# Patient Record
Sex: Female | Born: 1940
Health system: Southern US, Community
[De-identification: ages and names within clinical notes are randomized; demographics above are authoritative.]

## PROBLEM LIST (undated history)

## (undated) DIAGNOSIS — C801 Malignant (primary) neoplasm, unspecified: Secondary | ICD-10-CM

## (undated) DIAGNOSIS — F419 Anxiety disorder, unspecified: Secondary | ICD-10-CM

## (undated) DIAGNOSIS — R0600 Dyspnea, unspecified: Secondary | ICD-10-CM

## (undated) DIAGNOSIS — K219 Gastro-esophageal reflux disease without esophagitis: Secondary | ICD-10-CM

## (undated) DIAGNOSIS — E119 Type 2 diabetes mellitus without complications: Secondary | ICD-10-CM

## (undated) DIAGNOSIS — M1990 Unspecified osteoarthritis, unspecified site: Secondary | ICD-10-CM

## (undated) HISTORY — PX: APPENDECTOMY: SHX54

## (undated) HISTORY — PX: EYE SURGERY: SHX253

## (undated) HISTORY — PX: TUBAL LIGATION: SHX77

---

## 2015-02-11 DIAGNOSIS — K219 Gastro-esophageal reflux disease without esophagitis: Secondary | ICD-10-CM | POA: Diagnosis not present

## 2015-02-11 DIAGNOSIS — Q446 Cystic disease of liver: Secondary | ICD-10-CM | POA: Diagnosis not present

## 2015-02-11 DIAGNOSIS — E079 Disorder of thyroid, unspecified: Secondary | ICD-10-CM | POA: Diagnosis not present

## 2015-02-11 DIAGNOSIS — Z720 Tobacco use: Secondary | ICD-10-CM | POA: Diagnosis not present

## 2015-02-11 DIAGNOSIS — F411 Generalized anxiety disorder: Secondary | ICD-10-CM | POA: Diagnosis not present

## 2015-02-11 DIAGNOSIS — E782 Mixed hyperlipidemia: Secondary | ICD-10-CM | POA: Diagnosis not present

## 2015-02-11 DIAGNOSIS — J019 Acute sinusitis, unspecified: Secondary | ICD-10-CM | POA: Diagnosis not present

## 2015-02-11 DIAGNOSIS — R634 Abnormal weight loss: Secondary | ICD-10-CM | POA: Diagnosis not present

## 2015-05-12 DIAGNOSIS — Z720 Tobacco use: Secondary | ICD-10-CM | POA: Diagnosis not present

## 2015-05-12 DIAGNOSIS — F330 Major depressive disorder, recurrent, mild: Secondary | ICD-10-CM | POA: Diagnosis not present

## 2015-05-12 DIAGNOSIS — E782 Mixed hyperlipidemia: Secondary | ICD-10-CM | POA: Diagnosis not present

## 2015-05-12 DIAGNOSIS — J019 Acute sinusitis, unspecified: Secondary | ICD-10-CM | POA: Diagnosis not present

## 2015-05-12 DIAGNOSIS — K219 Gastro-esophageal reflux disease without esophagitis: Secondary | ICD-10-CM | POA: Diagnosis not present

## 2015-05-12 DIAGNOSIS — F411 Generalized anxiety disorder: Secondary | ICD-10-CM | POA: Diagnosis not present

## 2015-05-12 DIAGNOSIS — R634 Abnormal weight loss: Secondary | ICD-10-CM | POA: Diagnosis not present

## 2015-07-30 DIAGNOSIS — Z961 Presence of intraocular lens: Secondary | ICD-10-CM | POA: Diagnosis not present

## 2015-08-11 DIAGNOSIS — K219 Gastro-esophageal reflux disease without esophagitis: Secondary | ICD-10-CM | POA: Diagnosis not present

## 2015-08-11 DIAGNOSIS — E782 Mixed hyperlipidemia: Secondary | ICD-10-CM | POA: Diagnosis not present

## 2015-08-11 DIAGNOSIS — R634 Abnormal weight loss: Secondary | ICD-10-CM | POA: Diagnosis not present

## 2015-08-18 DIAGNOSIS — Z720 Tobacco use: Secondary | ICD-10-CM | POA: Diagnosis not present

## 2015-08-18 DIAGNOSIS — F330 Major depressive disorder, recurrent, mild: Secondary | ICD-10-CM | POA: Diagnosis not present

## 2015-08-18 DIAGNOSIS — E782 Mixed hyperlipidemia: Secondary | ICD-10-CM | POA: Diagnosis not present

## 2015-08-18 DIAGNOSIS — K219 Gastro-esophageal reflux disease without esophagitis: Secondary | ICD-10-CM | POA: Diagnosis not present

## 2015-08-18 DIAGNOSIS — R634 Abnormal weight loss: Secondary | ICD-10-CM | POA: Diagnosis not present

## 2015-08-18 DIAGNOSIS — F411 Generalized anxiety disorder: Secondary | ICD-10-CM | POA: Diagnosis not present

## 2015-08-18 DIAGNOSIS — Z6822 Body mass index (BMI) 22.0-22.9, adult: Secondary | ICD-10-CM | POA: Diagnosis not present

## 2015-11-24 DIAGNOSIS — Z23 Encounter for immunization: Secondary | ICD-10-CM | POA: Diagnosis not present

## 2016-02-17 DIAGNOSIS — K219 Gastro-esophageal reflux disease without esophagitis: Secondary | ICD-10-CM | POA: Diagnosis not present

## 2016-02-17 DIAGNOSIS — E559 Vitamin D deficiency, unspecified: Secondary | ICD-10-CM | POA: Diagnosis not present

## 2016-02-17 DIAGNOSIS — E782 Mixed hyperlipidemia: Secondary | ICD-10-CM | POA: Diagnosis not present

## 2016-02-17 DIAGNOSIS — Q446 Cystic disease of liver: Secondary | ICD-10-CM | POA: Diagnosis not present

## 2016-02-17 DIAGNOSIS — E079 Disorder of thyroid, unspecified: Secondary | ICD-10-CM | POA: Diagnosis not present

## 2016-02-20 DIAGNOSIS — E782 Mixed hyperlipidemia: Secondary | ICD-10-CM | POA: Diagnosis not present

## 2016-02-20 DIAGNOSIS — Z720 Tobacco use: Secondary | ICD-10-CM | POA: Diagnosis not present

## 2016-02-20 DIAGNOSIS — F330 Major depressive disorder, recurrent, mild: Secondary | ICD-10-CM | POA: Diagnosis not present

## 2016-02-20 DIAGNOSIS — K219 Gastro-esophageal reflux disease without esophagitis: Secondary | ICD-10-CM | POA: Diagnosis not present

## 2016-02-20 DIAGNOSIS — F411 Generalized anxiety disorder: Secondary | ICD-10-CM | POA: Diagnosis not present

## 2016-02-20 DIAGNOSIS — R634 Abnormal weight loss: Secondary | ICD-10-CM | POA: Diagnosis not present

## 2016-02-20 DIAGNOSIS — Z6820 Body mass index (BMI) 20.0-20.9, adult: Secondary | ICD-10-CM | POA: Diagnosis not present

## 2016-03-08 DIAGNOSIS — K219 Gastro-esophageal reflux disease without esophagitis: Secondary | ICD-10-CM | POA: Diagnosis not present

## 2016-03-08 DIAGNOSIS — Q446 Cystic disease of liver: Secondary | ICD-10-CM | POA: Diagnosis not present

## 2016-05-11 DIAGNOSIS — Q446 Cystic disease of liver: Secondary | ICD-10-CM | POA: Diagnosis not present

## 2016-05-11 DIAGNOSIS — K219 Gastro-esophageal reflux disease without esophagitis: Secondary | ICD-10-CM | POA: Diagnosis not present

## 2016-05-17 DIAGNOSIS — F411 Generalized anxiety disorder: Secondary | ICD-10-CM | POA: Diagnosis not present

## 2016-05-17 DIAGNOSIS — R634 Abnormal weight loss: Secondary | ICD-10-CM | POA: Diagnosis not present

## 2016-05-17 DIAGNOSIS — Z681 Body mass index (BMI) 19 or less, adult: Secondary | ICD-10-CM | POA: Diagnosis not present

## 2016-05-17 DIAGNOSIS — E875 Hyperkalemia: Secondary | ICD-10-CM | POA: Diagnosis not present

## 2016-05-17 DIAGNOSIS — Z720 Tobacco use: Secondary | ICD-10-CM | POA: Diagnosis not present

## 2016-06-07 DIAGNOSIS — Z720 Tobacco use: Secondary | ICD-10-CM | POA: Diagnosis not present

## 2016-06-07 DIAGNOSIS — F330 Major depressive disorder, recurrent, mild: Secondary | ICD-10-CM | POA: Diagnosis not present

## 2016-06-07 DIAGNOSIS — K219 Gastro-esophageal reflux disease without esophagitis: Secondary | ICD-10-CM | POA: Diagnosis not present

## 2016-06-07 DIAGNOSIS — R634 Abnormal weight loss: Secondary | ICD-10-CM | POA: Diagnosis not present

## 2016-06-07 DIAGNOSIS — F411 Generalized anxiety disorder: Secondary | ICD-10-CM | POA: Diagnosis not present

## 2016-06-07 DIAGNOSIS — E875 Hyperkalemia: Secondary | ICD-10-CM | POA: Diagnosis not present

## 2016-06-07 DIAGNOSIS — E782 Mixed hyperlipidemia: Secondary | ICD-10-CM | POA: Diagnosis not present

## 2016-06-10 DIAGNOSIS — R1110 Vomiting, unspecified: Secondary | ICD-10-CM | POA: Diagnosis not present

## 2016-06-10 DIAGNOSIS — R634 Abnormal weight loss: Secondary | ICD-10-CM | POA: Diagnosis not present

## 2016-06-10 DIAGNOSIS — R911 Solitary pulmonary nodule: Secondary | ICD-10-CM | POA: Diagnosis not present

## 2016-06-10 DIAGNOSIS — K5790 Diverticulosis of intestine, part unspecified, without perforation or abscess without bleeding: Secondary | ICD-10-CM | POA: Diagnosis not present

## 2016-06-10 DIAGNOSIS — N281 Cyst of kidney, acquired: Secondary | ICD-10-CM | POA: Diagnosis not present

## 2016-06-10 DIAGNOSIS — J439 Emphysema, unspecified: Secondary | ICD-10-CM | POA: Diagnosis not present

## 2016-06-10 DIAGNOSIS — K7689 Other specified diseases of liver: Secondary | ICD-10-CM | POA: Diagnosis not present

## 2016-06-10 DIAGNOSIS — I700 Atherosclerosis of aorta: Secondary | ICD-10-CM | POA: Diagnosis not present

## 2016-06-10 DIAGNOSIS — R1310 Dysphagia, unspecified: Secondary | ICD-10-CM | POA: Diagnosis not present

## 2016-06-10 DIAGNOSIS — R0602 Shortness of breath: Secondary | ICD-10-CM | POA: Diagnosis not present

## 2016-06-10 DIAGNOSIS — K5730 Diverticulosis of large intestine without perforation or abscess without bleeding: Secondary | ICD-10-CM | POA: Diagnosis not present

## 2016-06-10 DIAGNOSIS — E041 Nontoxic single thyroid nodule: Secondary | ICD-10-CM | POA: Diagnosis not present

## 2016-06-22 DIAGNOSIS — R634 Abnormal weight loss: Secondary | ICD-10-CM | POA: Diagnosis not present

## 2016-06-22 DIAGNOSIS — K222 Esophageal obstruction: Secondary | ICD-10-CM | POA: Diagnosis not present

## 2016-06-22 DIAGNOSIS — Z923 Personal history of irradiation: Secondary | ICD-10-CM | POA: Diagnosis not present

## 2016-06-22 DIAGNOSIS — Z9221 Personal history of antineoplastic chemotherapy: Secondary | ICD-10-CM | POA: Diagnosis not present

## 2016-06-22 DIAGNOSIS — R630 Anorexia: Secondary | ICD-10-CM | POA: Diagnosis not present

## 2016-06-22 DIAGNOSIS — F17210 Nicotine dependence, cigarettes, uncomplicated: Secondary | ICD-10-CM | POA: Diagnosis not present

## 2016-06-22 DIAGNOSIS — Z8501 Personal history of malignant neoplasm of esophagus: Secondary | ICD-10-CM | POA: Diagnosis not present

## 2016-06-22 DIAGNOSIS — Z681 Body mass index (BMI) 19 or less, adult: Secondary | ICD-10-CM | POA: Diagnosis not present

## 2016-08-30 DIAGNOSIS — F411 Generalized anxiety disorder: Secondary | ICD-10-CM | POA: Diagnosis not present

## 2016-08-30 DIAGNOSIS — R634 Abnormal weight loss: Secondary | ICD-10-CM | POA: Diagnosis not present

## 2016-08-30 DIAGNOSIS — E782 Mixed hyperlipidemia: Secondary | ICD-10-CM | POA: Diagnosis not present

## 2016-08-30 DIAGNOSIS — E875 Hyperkalemia: Secondary | ICD-10-CM | POA: Diagnosis not present

## 2016-08-30 DIAGNOSIS — Z720 Tobacco use: Secondary | ICD-10-CM | POA: Diagnosis not present

## 2016-08-30 DIAGNOSIS — F3289 Other specified depressive episodes: Secondary | ICD-10-CM | POA: Diagnosis not present

## 2016-08-30 DIAGNOSIS — J439 Emphysema, unspecified: Secondary | ICD-10-CM | POA: Diagnosis not present

## 2016-08-30 DIAGNOSIS — Z681 Body mass index (BMI) 19 or less, adult: Secondary | ICD-10-CM | POA: Diagnosis not present

## 2016-08-30 DIAGNOSIS — C159 Malignant neoplasm of esophagus, unspecified: Secondary | ICD-10-CM | POA: Diagnosis not present

## 2016-08-30 DIAGNOSIS — E440 Moderate protein-calorie malnutrition: Secondary | ICD-10-CM | POA: Diagnosis not present

## 2016-08-30 DIAGNOSIS — I700 Atherosclerosis of aorta: Secondary | ICD-10-CM | POA: Diagnosis not present

## 2016-08-30 DIAGNOSIS — Z23 Encounter for immunization: Secondary | ICD-10-CM | POA: Diagnosis not present

## 2016-08-30 DIAGNOSIS — K222 Esophageal obstruction: Secondary | ICD-10-CM | POA: Diagnosis not present

## 2016-11-26 DIAGNOSIS — Q446 Cystic disease of liver: Secondary | ICD-10-CM | POA: Diagnosis not present

## 2016-11-26 DIAGNOSIS — K219 Gastro-esophageal reflux disease without esophagitis: Secondary | ICD-10-CM | POA: Diagnosis not present

## 2016-11-26 DIAGNOSIS — E875 Hyperkalemia: Secondary | ICD-10-CM | POA: Diagnosis not present

## 2016-11-26 DIAGNOSIS — F411 Generalized anxiety disorder: Secondary | ICD-10-CM | POA: Diagnosis not present

## 2016-11-26 DIAGNOSIS — E782 Mixed hyperlipidemia: Secondary | ICD-10-CM | POA: Diagnosis not present

## 2016-11-30 DIAGNOSIS — E440 Moderate protein-calorie malnutrition: Secondary | ICD-10-CM | POA: Diagnosis not present

## 2016-11-30 DIAGNOSIS — I700 Atherosclerosis of aorta: Secondary | ICD-10-CM | POA: Diagnosis not present

## 2016-11-30 DIAGNOSIS — C159 Malignant neoplasm of esophagus, unspecified: Secondary | ICD-10-CM | POA: Diagnosis not present

## 2016-11-30 DIAGNOSIS — E782 Mixed hyperlipidemia: Secondary | ICD-10-CM | POA: Diagnosis not present

## 2016-11-30 DIAGNOSIS — Z23 Encounter for immunization: Secondary | ICD-10-CM | POA: Diagnosis not present

## 2016-11-30 DIAGNOSIS — J439 Emphysema, unspecified: Secondary | ICD-10-CM | POA: Diagnosis not present

## 2016-11-30 DIAGNOSIS — Z681 Body mass index (BMI) 19 or less, adult: Secondary | ICD-10-CM | POA: Diagnosis not present

## 2017-01-19 DIAGNOSIS — H26491 Other secondary cataract, right eye: Secondary | ICD-10-CM | POA: Diagnosis not present

## 2017-03-01 DIAGNOSIS — F411 Generalized anxiety disorder: Secondary | ICD-10-CM | POA: Diagnosis not present

## 2017-03-01 DIAGNOSIS — E782 Mixed hyperlipidemia: Secondary | ICD-10-CM | POA: Diagnosis not present

## 2017-03-01 DIAGNOSIS — J439 Emphysema, unspecified: Secondary | ICD-10-CM | POA: Diagnosis not present

## 2017-03-01 DIAGNOSIS — E079 Disorder of thyroid, unspecified: Secondary | ICD-10-CM | POA: Diagnosis not present

## 2017-03-01 DIAGNOSIS — E875 Hyperkalemia: Secondary | ICD-10-CM | POA: Diagnosis not present

## 2017-03-02 DIAGNOSIS — H26492 Other secondary cataract, left eye: Secondary | ICD-10-CM | POA: Diagnosis not present

## 2017-03-04 DIAGNOSIS — C159 Malignant neoplasm of esophagus, unspecified: Secondary | ICD-10-CM | POA: Diagnosis not present

## 2017-03-04 DIAGNOSIS — E440 Moderate protein-calorie malnutrition: Secondary | ICD-10-CM | POA: Diagnosis not present

## 2017-03-04 DIAGNOSIS — E782 Mixed hyperlipidemia: Secondary | ICD-10-CM | POA: Diagnosis not present

## 2017-03-04 DIAGNOSIS — Z0001 Encounter for general adult medical examination with abnormal findings: Secondary | ICD-10-CM | POA: Diagnosis not present

## 2017-03-04 DIAGNOSIS — F411 Generalized anxiety disorder: Secondary | ICD-10-CM | POA: Diagnosis not present

## 2017-03-04 DIAGNOSIS — R634 Abnormal weight loss: Secondary | ICD-10-CM | POA: Diagnosis not present

## 2017-03-04 DIAGNOSIS — E875 Hyperkalemia: Secondary | ICD-10-CM | POA: Diagnosis not present

## 2017-03-04 DIAGNOSIS — I700 Atherosclerosis of aorta: Secondary | ICD-10-CM | POA: Diagnosis not present

## 2017-03-04 DIAGNOSIS — Z720 Tobacco use: Secondary | ICD-10-CM | POA: Diagnosis not present

## 2017-03-04 DIAGNOSIS — R911 Solitary pulmonary nodule: Secondary | ICD-10-CM | POA: Diagnosis not present

## 2017-03-04 DIAGNOSIS — F3289 Other specified depressive episodes: Secondary | ICD-10-CM | POA: Diagnosis not present

## 2017-03-23 DIAGNOSIS — Z8501 Personal history of malignant neoplasm of esophagus: Secondary | ICD-10-CM | POA: Diagnosis not present

## 2017-03-23 DIAGNOSIS — K222 Esophageal obstruction: Secondary | ICD-10-CM | POA: Diagnosis not present

## 2017-03-23 DIAGNOSIS — R634 Abnormal weight loss: Secondary | ICD-10-CM | POA: Diagnosis not present

## 2017-03-23 DIAGNOSIS — F17200 Nicotine dependence, unspecified, uncomplicated: Secondary | ICD-10-CM | POA: Diagnosis not present

## 2017-03-23 DIAGNOSIS — R1310 Dysphagia, unspecified: Secondary | ICD-10-CM | POA: Diagnosis not present

## 2017-03-23 DIAGNOSIS — R1110 Vomiting, unspecified: Secondary | ICD-10-CM | POA: Diagnosis not present

## 2017-03-23 DIAGNOSIS — K228 Other specified diseases of esophagus: Secondary | ICD-10-CM | POA: Diagnosis not present

## 2017-03-23 DIAGNOSIS — K449 Diaphragmatic hernia without obstruction or gangrene: Secondary | ICD-10-CM | POA: Diagnosis not present

## 2017-04-27 DIAGNOSIS — F17200 Nicotine dependence, unspecified, uncomplicated: Secondary | ICD-10-CM | POA: Diagnosis not present

## 2017-04-27 DIAGNOSIS — Z7982 Long term (current) use of aspirin: Secondary | ICD-10-CM | POA: Diagnosis not present

## 2017-04-27 DIAGNOSIS — K449 Diaphragmatic hernia without obstruction or gangrene: Secondary | ICD-10-CM | POA: Diagnosis not present

## 2017-04-27 DIAGNOSIS — I10 Essential (primary) hypertension: Secondary | ICD-10-CM | POA: Diagnosis not present

## 2017-04-27 DIAGNOSIS — R1310 Dysphagia, unspecified: Secondary | ICD-10-CM | POA: Diagnosis not present

## 2017-04-27 DIAGNOSIS — Z8501 Personal history of malignant neoplasm of esophagus: Secondary | ICD-10-CM | POA: Diagnosis not present

## 2017-04-27 DIAGNOSIS — Z79899 Other long term (current) drug therapy: Secondary | ICD-10-CM | POA: Diagnosis not present

## 2017-04-27 DIAGNOSIS — K222 Esophageal obstruction: Secondary | ICD-10-CM | POA: Diagnosis not present

## 2017-06-08 DIAGNOSIS — K222 Esophageal obstruction: Secondary | ICD-10-CM | POA: Diagnosis not present

## 2017-06-08 DIAGNOSIS — K449 Diaphragmatic hernia without obstruction or gangrene: Secondary | ICD-10-CM | POA: Diagnosis not present

## 2017-06-08 DIAGNOSIS — Z8501 Personal history of malignant neoplasm of esophagus: Secondary | ICD-10-CM | POA: Diagnosis not present

## 2017-06-08 DIAGNOSIS — E785 Hyperlipidemia, unspecified: Secondary | ICD-10-CM | POA: Diagnosis not present

## 2017-06-08 DIAGNOSIS — Z720 Tobacco use: Secondary | ICD-10-CM | POA: Diagnosis not present

## 2017-07-27 DIAGNOSIS — E785 Hyperlipidemia, unspecified: Secondary | ICD-10-CM | POA: Diagnosis not present

## 2017-07-27 DIAGNOSIS — Z8501 Personal history of malignant neoplasm of esophagus: Secondary | ICD-10-CM | POA: Diagnosis not present

## 2017-07-27 DIAGNOSIS — K449 Diaphragmatic hernia without obstruction or gangrene: Secondary | ICD-10-CM | POA: Diagnosis not present

## 2017-07-27 DIAGNOSIS — K222 Esophageal obstruction: Secondary | ICD-10-CM | POA: Diagnosis not present

## 2017-07-27 DIAGNOSIS — R918 Other nonspecific abnormal finding of lung field: Secondary | ICD-10-CM | POA: Diagnosis not present

## 2017-07-27 DIAGNOSIS — K219 Gastro-esophageal reflux disease without esophagitis: Secondary | ICD-10-CM | POA: Diagnosis not present

## 2017-07-27 DIAGNOSIS — F419 Anxiety disorder, unspecified: Secondary | ICD-10-CM | POA: Diagnosis not present

## 2017-07-27 DIAGNOSIS — J90 Pleural effusion, not elsewhere classified: Secondary | ICD-10-CM | POA: Diagnosis not present

## 2017-07-27 DIAGNOSIS — F329 Major depressive disorder, single episode, unspecified: Secondary | ICD-10-CM | POA: Diagnosis not present

## 2017-07-27 DIAGNOSIS — Z9889 Other specified postprocedural states: Secondary | ICD-10-CM | POA: Diagnosis not present

## 2017-09-02 DIAGNOSIS — K219 Gastro-esophageal reflux disease without esophagitis: Secondary | ICD-10-CM | POA: Diagnosis not present

## 2017-09-02 DIAGNOSIS — E782 Mixed hyperlipidemia: Secondary | ICD-10-CM | POA: Diagnosis not present

## 2017-09-02 DIAGNOSIS — E875 Hyperkalemia: Secondary | ICD-10-CM | POA: Diagnosis not present

## 2017-09-02 DIAGNOSIS — R634 Abnormal weight loss: Secondary | ICD-10-CM | POA: Diagnosis not present

## 2017-09-02 DIAGNOSIS — E079 Disorder of thyroid, unspecified: Secondary | ICD-10-CM | POA: Diagnosis not present

## 2017-09-06 DIAGNOSIS — J439 Emphysema, unspecified: Secondary | ICD-10-CM | POA: Diagnosis not present

## 2017-09-06 DIAGNOSIS — Z681 Body mass index (BMI) 19 or less, adult: Secondary | ICD-10-CM | POA: Diagnosis not present

## 2017-09-06 DIAGNOSIS — Z720 Tobacco use: Secondary | ICD-10-CM | POA: Diagnosis not present

## 2017-09-06 DIAGNOSIS — E440 Moderate protein-calorie malnutrition: Secondary | ICD-10-CM | POA: Diagnosis not present

## 2017-09-06 DIAGNOSIS — E782 Mixed hyperlipidemia: Secondary | ICD-10-CM | POA: Diagnosis not present

## 2017-09-06 DIAGNOSIS — R911 Solitary pulmonary nodule: Secondary | ICD-10-CM | POA: Diagnosis not present

## 2017-09-06 DIAGNOSIS — I700 Atherosclerosis of aorta: Secondary | ICD-10-CM | POA: Diagnosis not present

## 2017-09-06 DIAGNOSIS — R634 Abnormal weight loss: Secondary | ICD-10-CM | POA: Diagnosis not present

## 2017-11-29 DIAGNOSIS — Z23 Encounter for immunization: Secondary | ICD-10-CM | POA: Diagnosis not present

## 2018-02-24 DIAGNOSIS — K219 Gastro-esophageal reflux disease without esophagitis: Secondary | ICD-10-CM | POA: Diagnosis not present

## 2018-02-24 DIAGNOSIS — E782 Mixed hyperlipidemia: Secondary | ICD-10-CM | POA: Diagnosis not present

## 2018-02-24 DIAGNOSIS — Q446 Cystic disease of liver: Secondary | ICD-10-CM | POA: Diagnosis not present

## 2018-02-24 DIAGNOSIS — E875 Hyperkalemia: Secondary | ICD-10-CM | POA: Diagnosis not present

## 2018-02-24 DIAGNOSIS — R634 Abnormal weight loss: Secondary | ICD-10-CM | POA: Diagnosis not present

## 2018-03-06 DIAGNOSIS — C159 Malignant neoplasm of esophagus, unspecified: Secondary | ICD-10-CM | POA: Diagnosis not present

## 2018-03-06 DIAGNOSIS — E440 Moderate protein-calorie malnutrition: Secondary | ICD-10-CM | POA: Diagnosis not present

## 2018-03-06 DIAGNOSIS — R911 Solitary pulmonary nodule: Secondary | ICD-10-CM | POA: Diagnosis not present

## 2018-03-06 DIAGNOSIS — E782 Mixed hyperlipidemia: Secondary | ICD-10-CM | POA: Diagnosis not present

## 2018-03-06 DIAGNOSIS — J439 Emphysema, unspecified: Secondary | ICD-10-CM | POA: Diagnosis not present

## 2018-03-06 DIAGNOSIS — I700 Atherosclerosis of aorta: Secondary | ICD-10-CM | POA: Diagnosis not present

## 2018-03-06 DIAGNOSIS — Z720 Tobacco use: Secondary | ICD-10-CM | POA: Diagnosis not present

## 2018-09-08 DIAGNOSIS — R634 Abnormal weight loss: Secondary | ICD-10-CM | POA: Diagnosis not present

## 2018-09-08 DIAGNOSIS — E079 Disorder of thyroid, unspecified: Secondary | ICD-10-CM | POA: Diagnosis not present

## 2018-09-08 DIAGNOSIS — E782 Mixed hyperlipidemia: Secondary | ICD-10-CM | POA: Diagnosis not present

## 2018-09-08 DIAGNOSIS — K219 Gastro-esophageal reflux disease without esophagitis: Secondary | ICD-10-CM | POA: Diagnosis not present

## 2018-09-08 DIAGNOSIS — E875 Hyperkalemia: Secondary | ICD-10-CM | POA: Diagnosis not present

## 2018-09-11 DIAGNOSIS — Z681 Body mass index (BMI) 19 or less, adult: Secondary | ICD-10-CM | POA: Diagnosis not present

## 2018-09-11 DIAGNOSIS — R634 Abnormal weight loss: Secondary | ICD-10-CM | POA: Diagnosis not present

## 2018-09-11 DIAGNOSIS — I700 Atherosclerosis of aorta: Secondary | ICD-10-CM | POA: Diagnosis not present

## 2018-09-11 DIAGNOSIS — R911 Solitary pulmonary nodule: Secondary | ICD-10-CM | POA: Diagnosis not present

## 2018-09-11 DIAGNOSIS — J439 Emphysema, unspecified: Secondary | ICD-10-CM | POA: Diagnosis not present

## 2018-09-11 DIAGNOSIS — E875 Hyperkalemia: Secondary | ICD-10-CM | POA: Diagnosis not present

## 2018-09-11 DIAGNOSIS — C159 Malignant neoplasm of esophagus, unspecified: Secondary | ICD-10-CM | POA: Diagnosis not present

## 2018-09-11 DIAGNOSIS — E440 Moderate protein-calorie malnutrition: Secondary | ICD-10-CM | POA: Diagnosis not present

## 2018-09-26 DIAGNOSIS — Z08 Encounter for follow-up examination after completed treatment for malignant neoplasm: Secondary | ICD-10-CM | POA: Diagnosis not present

## 2018-12-05 DIAGNOSIS — Z23 Encounter for immunization: Secondary | ICD-10-CM | POA: Diagnosis not present

## 2019-03-09 DIAGNOSIS — E7849 Other hyperlipidemia: Secondary | ICD-10-CM | POA: Diagnosis not present

## 2019-03-09 DIAGNOSIS — F411 Generalized anxiety disorder: Secondary | ICD-10-CM | POA: Diagnosis not present

## 2019-04-12 DIAGNOSIS — Z23 Encounter for immunization: Secondary | ICD-10-CM | POA: Diagnosis not present

## 2019-05-09 DIAGNOSIS — I10 Essential (primary) hypertension: Secondary | ICD-10-CM | POA: Diagnosis not present

## 2019-05-09 DIAGNOSIS — E7849 Other hyperlipidemia: Secondary | ICD-10-CM | POA: Diagnosis not present

## 2019-05-10 DIAGNOSIS — Z23 Encounter for immunization: Secondary | ICD-10-CM | POA: Diagnosis not present

## 2019-05-29 DIAGNOSIS — E079 Disorder of thyroid, unspecified: Secondary | ICD-10-CM | POA: Diagnosis not present

## 2019-05-29 DIAGNOSIS — Q446 Cystic disease of liver: Secondary | ICD-10-CM | POA: Diagnosis not present

## 2019-05-29 DIAGNOSIS — E782 Mixed hyperlipidemia: Secondary | ICD-10-CM | POA: Diagnosis not present

## 2019-05-29 DIAGNOSIS — F17210 Nicotine dependence, cigarettes, uncomplicated: Secondary | ICD-10-CM | POA: Diagnosis not present

## 2019-05-29 DIAGNOSIS — K219 Gastro-esophageal reflux disease without esophagitis: Secondary | ICD-10-CM | POA: Diagnosis not present

## 2019-06-04 DIAGNOSIS — Z681 Body mass index (BMI) 19 or less, adult: Secondary | ICD-10-CM | POA: Diagnosis not present

## 2019-06-04 DIAGNOSIS — F17210 Nicotine dependence, cigarettes, uncomplicated: Secondary | ICD-10-CM | POA: Diagnosis not present

## 2019-06-04 DIAGNOSIS — Z23 Encounter for immunization: Secondary | ICD-10-CM | POA: Diagnosis not present

## 2019-06-04 DIAGNOSIS — F331 Major depressive disorder, recurrent, moderate: Secondary | ICD-10-CM | POA: Diagnosis not present

## 2019-06-04 DIAGNOSIS — E782 Mixed hyperlipidemia: Secondary | ICD-10-CM | POA: Diagnosis not present

## 2019-06-04 DIAGNOSIS — K219 Gastro-esophageal reflux disease without esophagitis: Secondary | ICD-10-CM | POA: Diagnosis not present

## 2019-06-04 DIAGNOSIS — F411 Generalized anxiety disorder: Secondary | ICD-10-CM | POA: Diagnosis not present

## 2019-06-04 DIAGNOSIS — I700 Atherosclerosis of aorta: Secondary | ICD-10-CM | POA: Diagnosis not present

## 2019-07-09 DIAGNOSIS — Q446 Cystic disease of liver: Secondary | ICD-10-CM | POA: Diagnosis not present

## 2019-07-09 DIAGNOSIS — I700 Atherosclerosis of aorta: Secondary | ICD-10-CM | POA: Diagnosis not present

## 2019-07-09 DIAGNOSIS — E875 Hyperkalemia: Secondary | ICD-10-CM | POA: Diagnosis not present

## 2019-07-09 DIAGNOSIS — Z87891 Personal history of nicotine dependence: Secondary | ICD-10-CM | POA: Diagnosis not present

## 2019-09-07 DIAGNOSIS — Q446 Cystic disease of liver: Secondary | ICD-10-CM | POA: Diagnosis not present

## 2019-09-07 DIAGNOSIS — Z87891 Personal history of nicotine dependence: Secondary | ICD-10-CM | POA: Diagnosis not present

## 2019-09-07 DIAGNOSIS — I700 Atherosclerosis of aorta: Secondary | ICD-10-CM | POA: Diagnosis not present

## 2019-09-07 DIAGNOSIS — E875 Hyperkalemia: Secondary | ICD-10-CM | POA: Diagnosis not present

## 2019-09-27 DIAGNOSIS — K219 Gastro-esophageal reflux disease without esophagitis: Secondary | ICD-10-CM | POA: Diagnosis not present

## 2019-09-27 DIAGNOSIS — E875 Hyperkalemia: Secondary | ICD-10-CM | POA: Diagnosis not present

## 2019-09-27 DIAGNOSIS — E782 Mixed hyperlipidemia: Secondary | ICD-10-CM | POA: Diagnosis not present

## 2019-10-02 DIAGNOSIS — F411 Generalized anxiety disorder: Secondary | ICD-10-CM | POA: Diagnosis not present

## 2019-10-02 DIAGNOSIS — Z681 Body mass index (BMI) 19 or less, adult: Secondary | ICD-10-CM | POA: Diagnosis not present

## 2019-10-02 DIAGNOSIS — K219 Gastro-esophageal reflux disease without esophagitis: Secondary | ICD-10-CM | POA: Diagnosis not present

## 2019-10-02 DIAGNOSIS — F331 Major depressive disorder, recurrent, moderate: Secondary | ICD-10-CM | POA: Diagnosis not present

## 2019-10-02 DIAGNOSIS — E782 Mixed hyperlipidemia: Secondary | ICD-10-CM | POA: Diagnosis not present

## 2019-10-02 DIAGNOSIS — F17210 Nicotine dependence, cigarettes, uncomplicated: Secondary | ICD-10-CM | POA: Diagnosis not present

## 2019-10-02 DIAGNOSIS — I700 Atherosclerosis of aorta: Secondary | ICD-10-CM | POA: Diagnosis not present

## 2019-10-02 DIAGNOSIS — Z23 Encounter for immunization: Secondary | ICD-10-CM | POA: Diagnosis not present

## 2019-10-05 DIAGNOSIS — W01198A Fall on same level from slipping, tripping and stumbling with subsequent striking against other object, initial encounter: Secondary | ICD-10-CM | POA: Diagnosis not present

## 2019-10-05 DIAGNOSIS — S42202A Unspecified fracture of upper end of left humerus, initial encounter for closed fracture: Secondary | ICD-10-CM | POA: Diagnosis not present

## 2019-10-05 DIAGNOSIS — S42295A Other nondisplaced fracture of upper end of left humerus, initial encounter for closed fracture: Secondary | ICD-10-CM | POA: Diagnosis not present

## 2019-10-05 DIAGNOSIS — S42215A Unspecified nondisplaced fracture of surgical neck of left humerus, initial encounter for closed fracture: Secondary | ICD-10-CM | POA: Diagnosis not present

## 2019-10-05 DIAGNOSIS — S42255A Nondisplaced fracture of greater tuberosity of left humerus, initial encounter for closed fracture: Secondary | ICD-10-CM | POA: Diagnosis not present

## 2019-10-05 DIAGNOSIS — F17200 Nicotine dependence, unspecified, uncomplicated: Secondary | ICD-10-CM | POA: Diagnosis not present

## 2019-10-08 DIAGNOSIS — S42202A Unspecified fracture of upper end of left humerus, initial encounter for closed fracture: Secondary | ICD-10-CM | POA: Diagnosis not present

## 2019-10-10 DIAGNOSIS — S42295A Other nondisplaced fracture of upper end of left humerus, initial encounter for closed fracture: Secondary | ICD-10-CM | POA: Diagnosis not present

## 2019-10-18 DIAGNOSIS — Z961 Presence of intraocular lens: Secondary | ICD-10-CM | POA: Diagnosis not present

## 2019-10-18 DIAGNOSIS — H04123 Dry eye syndrome of bilateral lacrimal glands: Secondary | ICD-10-CM | POA: Diagnosis not present

## 2019-10-18 DIAGNOSIS — H524 Presbyopia: Secondary | ICD-10-CM | POA: Diagnosis not present

## 2019-10-18 DIAGNOSIS — H5202 Hypermetropia, left eye: Secondary | ICD-10-CM | POA: Diagnosis not present

## 2019-10-18 DIAGNOSIS — H43813 Vitreous degeneration, bilateral: Secondary | ICD-10-CM | POA: Diagnosis not present

## 2019-10-18 DIAGNOSIS — Z9849 Cataract extraction status, unspecified eye: Secondary | ICD-10-CM | POA: Diagnosis not present

## 2019-10-18 DIAGNOSIS — H02413 Mechanical ptosis of bilateral eyelids: Secondary | ICD-10-CM | POA: Diagnosis not present

## 2019-10-18 DIAGNOSIS — H52223 Regular astigmatism, bilateral: Secondary | ICD-10-CM | POA: Diagnosis not present

## 2019-10-30 DIAGNOSIS — S42202D Unspecified fracture of upper end of left humerus, subsequent encounter for fracture with routine healing: Secondary | ICD-10-CM | POA: Diagnosis not present

## 2019-11-07 DIAGNOSIS — Z23 Encounter for immunization: Secondary | ICD-10-CM | POA: Diagnosis not present

## 2019-11-08 DIAGNOSIS — E875 Hyperkalemia: Secondary | ICD-10-CM | POA: Diagnosis not present

## 2019-11-08 DIAGNOSIS — Q446 Cystic disease of liver: Secondary | ICD-10-CM | POA: Diagnosis not present

## 2019-11-08 DIAGNOSIS — Z87891 Personal history of nicotine dependence: Secondary | ICD-10-CM | POA: Diagnosis not present

## 2019-11-08 DIAGNOSIS — I700 Atherosclerosis of aorta: Secondary | ICD-10-CM | POA: Diagnosis not present

## 2019-11-13 DIAGNOSIS — S42202D Unspecified fracture of upper end of left humerus, subsequent encounter for fracture with routine healing: Secondary | ICD-10-CM | POA: Diagnosis not present

## 2019-11-23 DIAGNOSIS — S42455S Nondisplaced fracture of lateral condyle of left humerus, sequela: Secondary | ICD-10-CM | POA: Diagnosis not present

## 2019-11-23 DIAGNOSIS — R531 Weakness: Secondary | ICD-10-CM | POA: Diagnosis not present

## 2019-11-23 DIAGNOSIS — M25622 Stiffness of left elbow, not elsewhere classified: Secondary | ICD-10-CM | POA: Diagnosis not present

## 2019-11-23 DIAGNOSIS — M25612 Stiffness of left shoulder, not elsewhere classified: Secondary | ICD-10-CM | POA: Diagnosis not present

## 2019-11-27 DIAGNOSIS — R531 Weakness: Secondary | ICD-10-CM | POA: Diagnosis not present

## 2019-11-27 DIAGNOSIS — M25622 Stiffness of left elbow, not elsewhere classified: Secondary | ICD-10-CM | POA: Diagnosis not present

## 2019-11-27 DIAGNOSIS — S42455S Nondisplaced fracture of lateral condyle of left humerus, sequela: Secondary | ICD-10-CM | POA: Diagnosis not present

## 2019-11-27 DIAGNOSIS — M25612 Stiffness of left shoulder, not elsewhere classified: Secondary | ICD-10-CM | POA: Diagnosis not present

## 2019-11-30 DIAGNOSIS — R531 Weakness: Secondary | ICD-10-CM | POA: Diagnosis not present

## 2019-11-30 DIAGNOSIS — M25612 Stiffness of left shoulder, not elsewhere classified: Secondary | ICD-10-CM | POA: Diagnosis not present

## 2019-11-30 DIAGNOSIS — M25622 Stiffness of left elbow, not elsewhere classified: Secondary | ICD-10-CM | POA: Diagnosis not present

## 2019-11-30 DIAGNOSIS — S42455S Nondisplaced fracture of lateral condyle of left humerus, sequela: Secondary | ICD-10-CM | POA: Diagnosis not present

## 2019-12-04 DIAGNOSIS — S42202D Unspecified fracture of upper end of left humerus, subsequent encounter for fracture with routine healing: Secondary | ICD-10-CM | POA: Diagnosis not present

## 2019-12-04 DIAGNOSIS — M25612 Stiffness of left shoulder, not elsewhere classified: Secondary | ICD-10-CM | POA: Diagnosis not present

## 2019-12-04 DIAGNOSIS — M25622 Stiffness of left elbow, not elsewhere classified: Secondary | ICD-10-CM | POA: Diagnosis not present

## 2019-12-04 DIAGNOSIS — R531 Weakness: Secondary | ICD-10-CM | POA: Diagnosis not present

## 2019-12-04 DIAGNOSIS — S42455S Nondisplaced fracture of lateral condyle of left humerus, sequela: Secondary | ICD-10-CM | POA: Diagnosis not present

## 2019-12-06 DIAGNOSIS — S42455S Nondisplaced fracture of lateral condyle of left humerus, sequela: Secondary | ICD-10-CM | POA: Diagnosis not present

## 2019-12-06 DIAGNOSIS — M25612 Stiffness of left shoulder, not elsewhere classified: Secondary | ICD-10-CM | POA: Diagnosis not present

## 2019-12-06 DIAGNOSIS — M25622 Stiffness of left elbow, not elsewhere classified: Secondary | ICD-10-CM | POA: Diagnosis not present

## 2019-12-06 DIAGNOSIS — R531 Weakness: Secondary | ICD-10-CM | POA: Diagnosis not present

## 2019-12-08 DIAGNOSIS — I700 Atherosclerosis of aorta: Secondary | ICD-10-CM | POA: Diagnosis not present

## 2019-12-08 DIAGNOSIS — Q446 Cystic disease of liver: Secondary | ICD-10-CM | POA: Diagnosis not present

## 2019-12-08 DIAGNOSIS — Z87891 Personal history of nicotine dependence: Secondary | ICD-10-CM | POA: Diagnosis not present

## 2019-12-08 DIAGNOSIS — E875 Hyperkalemia: Secondary | ICD-10-CM | POA: Diagnosis not present

## 2019-12-11 DIAGNOSIS — R531 Weakness: Secondary | ICD-10-CM | POA: Diagnosis not present

## 2019-12-11 DIAGNOSIS — S42455S Nondisplaced fracture of lateral condyle of left humerus, sequela: Secondary | ICD-10-CM | POA: Diagnosis not present

## 2019-12-11 DIAGNOSIS — M25622 Stiffness of left elbow, not elsewhere classified: Secondary | ICD-10-CM | POA: Diagnosis not present

## 2019-12-11 DIAGNOSIS — M25612 Stiffness of left shoulder, not elsewhere classified: Secondary | ICD-10-CM | POA: Diagnosis not present

## 2019-12-14 DIAGNOSIS — M25612 Stiffness of left shoulder, not elsewhere classified: Secondary | ICD-10-CM | POA: Diagnosis not present

## 2019-12-14 DIAGNOSIS — M25622 Stiffness of left elbow, not elsewhere classified: Secondary | ICD-10-CM | POA: Diagnosis not present

## 2019-12-14 DIAGNOSIS — S42455S Nondisplaced fracture of lateral condyle of left humerus, sequela: Secondary | ICD-10-CM | POA: Diagnosis not present

## 2019-12-14 DIAGNOSIS — R531 Weakness: Secondary | ICD-10-CM | POA: Diagnosis not present

## 2019-12-20 DIAGNOSIS — M25612 Stiffness of left shoulder, not elsewhere classified: Secondary | ICD-10-CM | POA: Diagnosis not present

## 2019-12-20 DIAGNOSIS — M25622 Stiffness of left elbow, not elsewhere classified: Secondary | ICD-10-CM | POA: Diagnosis not present

## 2019-12-20 DIAGNOSIS — R531 Weakness: Secondary | ICD-10-CM | POA: Diagnosis not present

## 2019-12-20 DIAGNOSIS — S42455S Nondisplaced fracture of lateral condyle of left humerus, sequela: Secondary | ICD-10-CM | POA: Diagnosis not present

## 2019-12-21 DIAGNOSIS — S42455S Nondisplaced fracture of lateral condyle of left humerus, sequela: Secondary | ICD-10-CM | POA: Diagnosis not present

## 2019-12-21 DIAGNOSIS — M25622 Stiffness of left elbow, not elsewhere classified: Secondary | ICD-10-CM | POA: Diagnosis not present

## 2019-12-21 DIAGNOSIS — R531 Weakness: Secondary | ICD-10-CM | POA: Diagnosis not present

## 2019-12-21 DIAGNOSIS — M25612 Stiffness of left shoulder, not elsewhere classified: Secondary | ICD-10-CM | POA: Diagnosis not present

## 2019-12-27 DIAGNOSIS — S42455S Nondisplaced fracture of lateral condyle of left humerus, sequela: Secondary | ICD-10-CM | POA: Diagnosis not present

## 2019-12-27 DIAGNOSIS — M25622 Stiffness of left elbow, not elsewhere classified: Secondary | ICD-10-CM | POA: Diagnosis not present

## 2019-12-27 DIAGNOSIS — R531 Weakness: Secondary | ICD-10-CM | POA: Diagnosis not present

## 2019-12-27 DIAGNOSIS — M25612 Stiffness of left shoulder, not elsewhere classified: Secondary | ICD-10-CM | POA: Diagnosis not present

## 2019-12-28 DIAGNOSIS — S42455S Nondisplaced fracture of lateral condyle of left humerus, sequela: Secondary | ICD-10-CM | POA: Diagnosis not present

## 2019-12-28 DIAGNOSIS — M25612 Stiffness of left shoulder, not elsewhere classified: Secondary | ICD-10-CM | POA: Diagnosis not present

## 2019-12-28 DIAGNOSIS — R531 Weakness: Secondary | ICD-10-CM | POA: Diagnosis not present

## 2019-12-28 DIAGNOSIS — M25622 Stiffness of left elbow, not elsewhere classified: Secondary | ICD-10-CM | POA: Diagnosis not present

## 2020-01-01 DIAGNOSIS — S42202A Unspecified fracture of upper end of left humerus, initial encounter for closed fracture: Secondary | ICD-10-CM | POA: Diagnosis not present

## 2020-01-08 DIAGNOSIS — S42455S Nondisplaced fracture of lateral condyle of left humerus, sequela: Secondary | ICD-10-CM | POA: Diagnosis not present

## 2020-01-08 DIAGNOSIS — R531 Weakness: Secondary | ICD-10-CM | POA: Diagnosis not present

## 2020-01-08 DIAGNOSIS — Z87891 Personal history of nicotine dependence: Secondary | ICD-10-CM | POA: Diagnosis not present

## 2020-01-08 DIAGNOSIS — M25622 Stiffness of left elbow, not elsewhere classified: Secondary | ICD-10-CM | POA: Diagnosis not present

## 2020-01-08 DIAGNOSIS — M25612 Stiffness of left shoulder, not elsewhere classified: Secondary | ICD-10-CM | POA: Diagnosis not present

## 2020-01-08 DIAGNOSIS — E875 Hyperkalemia: Secondary | ICD-10-CM | POA: Diagnosis not present

## 2020-01-08 DIAGNOSIS — I700 Atherosclerosis of aorta: Secondary | ICD-10-CM | POA: Diagnosis not present

## 2020-01-10 DIAGNOSIS — R531 Weakness: Secondary | ICD-10-CM | POA: Diagnosis not present

## 2020-01-10 DIAGNOSIS — S42455S Nondisplaced fracture of lateral condyle of left humerus, sequela: Secondary | ICD-10-CM | POA: Diagnosis not present

## 2020-01-10 DIAGNOSIS — M25612 Stiffness of left shoulder, not elsewhere classified: Secondary | ICD-10-CM | POA: Diagnosis not present

## 2020-01-10 DIAGNOSIS — M25622 Stiffness of left elbow, not elsewhere classified: Secondary | ICD-10-CM | POA: Diagnosis not present

## 2020-01-15 DIAGNOSIS — M25612 Stiffness of left shoulder, not elsewhere classified: Secondary | ICD-10-CM | POA: Diagnosis not present

## 2020-01-15 DIAGNOSIS — R531 Weakness: Secondary | ICD-10-CM | POA: Diagnosis not present

## 2020-01-15 DIAGNOSIS — M25622 Stiffness of left elbow, not elsewhere classified: Secondary | ICD-10-CM | POA: Diagnosis not present

## 2020-01-15 DIAGNOSIS — S42455S Nondisplaced fracture of lateral condyle of left humerus, sequela: Secondary | ICD-10-CM | POA: Diagnosis not present

## 2020-01-17 DIAGNOSIS — M25612 Stiffness of left shoulder, not elsewhere classified: Secondary | ICD-10-CM | POA: Diagnosis not present

## 2020-01-17 DIAGNOSIS — M25622 Stiffness of left elbow, not elsewhere classified: Secondary | ICD-10-CM | POA: Diagnosis not present

## 2020-01-17 DIAGNOSIS — S42455S Nondisplaced fracture of lateral condyle of left humerus, sequela: Secondary | ICD-10-CM | POA: Diagnosis not present

## 2020-01-17 DIAGNOSIS — R531 Weakness: Secondary | ICD-10-CM | POA: Diagnosis not present

## 2020-01-22 DIAGNOSIS — M25612 Stiffness of left shoulder, not elsewhere classified: Secondary | ICD-10-CM | POA: Diagnosis not present

## 2020-01-22 DIAGNOSIS — S42455S Nondisplaced fracture of lateral condyle of left humerus, sequela: Secondary | ICD-10-CM | POA: Diagnosis not present

## 2020-01-22 DIAGNOSIS — R531 Weakness: Secondary | ICD-10-CM | POA: Diagnosis not present

## 2020-01-22 DIAGNOSIS — M25622 Stiffness of left elbow, not elsewhere classified: Secondary | ICD-10-CM | POA: Diagnosis not present

## 2020-01-30 DIAGNOSIS — R911 Solitary pulmonary nodule: Secondary | ICD-10-CM | POA: Diagnosis not present

## 2020-01-30 DIAGNOSIS — K219 Gastro-esophageal reflux disease without esophagitis: Secondary | ICD-10-CM | POA: Diagnosis not present

## 2020-01-30 DIAGNOSIS — E782 Mixed hyperlipidemia: Secondary | ICD-10-CM | POA: Diagnosis not present

## 2020-01-30 DIAGNOSIS — E079 Disorder of thyroid, unspecified: Secondary | ICD-10-CM | POA: Diagnosis not present

## 2020-01-30 DIAGNOSIS — E559 Vitamin D deficiency, unspecified: Secondary | ICD-10-CM | POA: Diagnosis not present

## 2020-01-30 DIAGNOSIS — Q446 Cystic disease of liver: Secondary | ICD-10-CM | POA: Diagnosis not present

## 2020-01-30 DIAGNOSIS — Z720 Tobacco use: Secondary | ICD-10-CM | POA: Diagnosis not present

## 2020-02-04 DIAGNOSIS — Z23 Encounter for immunization: Secondary | ICD-10-CM | POA: Diagnosis not present

## 2020-02-05 DIAGNOSIS — S42202D Unspecified fracture of upper end of left humerus, subsequent encounter for fracture with routine healing: Secondary | ICD-10-CM | POA: Diagnosis not present

## 2020-02-07 DIAGNOSIS — K2100 Gastro-esophageal reflux disease with esophagitis, without bleeding: Secondary | ICD-10-CM | POA: Diagnosis not present

## 2020-02-07 DIAGNOSIS — F331 Major depressive disorder, recurrent, moderate: Secondary | ICD-10-CM | POA: Diagnosis not present

## 2020-02-07 DIAGNOSIS — I700 Atherosclerosis of aorta: Secondary | ICD-10-CM | POA: Diagnosis not present

## 2020-02-07 DIAGNOSIS — E782 Mixed hyperlipidemia: Secondary | ICD-10-CM | POA: Diagnosis not present

## 2020-02-07 DIAGNOSIS — Z0001 Encounter for general adult medical examination with abnormal findings: Secondary | ICD-10-CM | POA: Diagnosis not present

## 2020-02-07 DIAGNOSIS — R4582 Worries: Secondary | ICD-10-CM | POA: Diagnosis not present

## 2020-02-07 DIAGNOSIS — F411 Generalized anxiety disorder: Secondary | ICD-10-CM | POA: Diagnosis not present

## 2020-02-07 DIAGNOSIS — J449 Chronic obstructive pulmonary disease, unspecified: Secondary | ICD-10-CM | POA: Diagnosis not present

## 2020-02-08 DIAGNOSIS — E875 Hyperkalemia: Secondary | ICD-10-CM | POA: Diagnosis not present

## 2020-02-08 DIAGNOSIS — I700 Atherosclerosis of aorta: Secondary | ICD-10-CM | POA: Diagnosis not present

## 2020-02-08 DIAGNOSIS — Z87891 Personal history of nicotine dependence: Secondary | ICD-10-CM | POA: Diagnosis not present

## 2020-02-08 DIAGNOSIS — Q446 Cystic disease of liver: Secondary | ICD-10-CM | POA: Diagnosis not present

## 2020-03-04 DIAGNOSIS — S42202D Unspecified fracture of upper end of left humerus, subsequent encounter for fracture with routine healing: Secondary | ICD-10-CM | POA: Diagnosis not present

## 2020-05-07 DIAGNOSIS — I700 Atherosclerosis of aorta: Secondary | ICD-10-CM | POA: Diagnosis not present

## 2020-05-07 DIAGNOSIS — Z87891 Personal history of nicotine dependence: Secondary | ICD-10-CM | POA: Diagnosis not present

## 2020-05-07 DIAGNOSIS — Q446 Cystic disease of liver: Secondary | ICD-10-CM | POA: Diagnosis not present

## 2020-05-07 DIAGNOSIS — E875 Hyperkalemia: Secondary | ICD-10-CM | POA: Diagnosis not present

## 2020-06-03 DIAGNOSIS — K219 Gastro-esophageal reflux disease without esophagitis: Secondary | ICD-10-CM | POA: Diagnosis not present

## 2020-06-03 DIAGNOSIS — J449 Chronic obstructive pulmonary disease, unspecified: Secondary | ICD-10-CM | POA: Diagnosis not present

## 2020-06-03 DIAGNOSIS — E782 Mixed hyperlipidemia: Secondary | ICD-10-CM | POA: Diagnosis not present

## 2020-06-03 DIAGNOSIS — R946 Abnormal results of thyroid function studies: Secondary | ICD-10-CM | POA: Diagnosis not present

## 2020-06-03 DIAGNOSIS — E7849 Other hyperlipidemia: Secondary | ICD-10-CM | POA: Diagnosis not present

## 2020-06-07 DIAGNOSIS — Q446 Cystic disease of liver: Secondary | ICD-10-CM | POA: Diagnosis not present

## 2020-06-07 DIAGNOSIS — Z87891 Personal history of nicotine dependence: Secondary | ICD-10-CM | POA: Diagnosis not present

## 2020-06-07 DIAGNOSIS — I700 Atherosclerosis of aorta: Secondary | ICD-10-CM | POA: Diagnosis not present

## 2020-06-07 DIAGNOSIS — E875 Hyperkalemia: Secondary | ICD-10-CM | POA: Diagnosis not present

## 2020-06-10 DIAGNOSIS — J449 Chronic obstructive pulmonary disease, unspecified: Secondary | ICD-10-CM | POA: Diagnosis not present

## 2020-06-10 DIAGNOSIS — K2100 Gastro-esophageal reflux disease with esophagitis, without bleeding: Secondary | ICD-10-CM | POA: Diagnosis not present

## 2020-06-10 DIAGNOSIS — Z23 Encounter for immunization: Secondary | ICD-10-CM | POA: Diagnosis not present

## 2020-06-10 DIAGNOSIS — I700 Atherosclerosis of aorta: Secondary | ICD-10-CM | POA: Diagnosis not present

## 2020-06-10 DIAGNOSIS — F411 Generalized anxiety disorder: Secondary | ICD-10-CM | POA: Diagnosis not present

## 2020-06-10 DIAGNOSIS — F17210 Nicotine dependence, cigarettes, uncomplicated: Secondary | ICD-10-CM | POA: Diagnosis not present

## 2020-06-10 DIAGNOSIS — E7849 Other hyperlipidemia: Secondary | ICD-10-CM | POA: Diagnosis not present

## 2020-06-10 DIAGNOSIS — R4582 Worries: Secondary | ICD-10-CM | POA: Diagnosis not present

## 2020-07-07 DIAGNOSIS — I700 Atherosclerosis of aorta: Secondary | ICD-10-CM | POA: Diagnosis not present

## 2020-07-07 DIAGNOSIS — Q446 Cystic disease of liver: Secondary | ICD-10-CM | POA: Diagnosis not present

## 2020-07-07 DIAGNOSIS — E875 Hyperkalemia: Secondary | ICD-10-CM | POA: Diagnosis not present

## 2020-07-07 DIAGNOSIS — Z87891 Personal history of nicotine dependence: Secondary | ICD-10-CM | POA: Diagnosis not present

## 2020-08-07 DIAGNOSIS — Z87891 Personal history of nicotine dependence: Secondary | ICD-10-CM | POA: Diagnosis not present

## 2020-08-07 DIAGNOSIS — Q446 Cystic disease of liver: Secondary | ICD-10-CM | POA: Diagnosis not present

## 2020-08-07 DIAGNOSIS — I700 Atherosclerosis of aorta: Secondary | ICD-10-CM | POA: Diagnosis not present

## 2020-08-07 DIAGNOSIS — E875 Hyperkalemia: Secondary | ICD-10-CM | POA: Diagnosis not present

## 2020-10-06 DIAGNOSIS — K2100 Gastro-esophageal reflux disease with esophagitis, without bleeding: Secondary | ICD-10-CM | POA: Diagnosis not present

## 2020-10-06 DIAGNOSIS — E7849 Other hyperlipidemia: Secondary | ICD-10-CM | POA: Diagnosis not present

## 2020-10-06 DIAGNOSIS — E782 Mixed hyperlipidemia: Secondary | ICD-10-CM | POA: Diagnosis not present

## 2020-10-06 DIAGNOSIS — E559 Vitamin D deficiency, unspecified: Secondary | ICD-10-CM | POA: Diagnosis not present

## 2020-10-06 DIAGNOSIS — E875 Hyperkalemia: Secondary | ICD-10-CM | POA: Diagnosis not present

## 2020-10-08 DIAGNOSIS — I700 Atherosclerosis of aorta: Secondary | ICD-10-CM | POA: Diagnosis not present

## 2020-10-08 DIAGNOSIS — E875 Hyperkalemia: Secondary | ICD-10-CM | POA: Diagnosis not present

## 2020-10-08 DIAGNOSIS — Q446 Cystic disease of liver: Secondary | ICD-10-CM | POA: Diagnosis not present

## 2020-10-08 DIAGNOSIS — Z87891 Personal history of nicotine dependence: Secondary | ICD-10-CM | POA: Diagnosis not present

## 2020-10-09 DIAGNOSIS — I700 Atherosclerosis of aorta: Secondary | ICD-10-CM | POA: Diagnosis not present

## 2020-10-09 DIAGNOSIS — K2100 Gastro-esophageal reflux disease with esophagitis, without bleeding: Secondary | ICD-10-CM | POA: Diagnosis not present

## 2020-10-09 DIAGNOSIS — F411 Generalized anxiety disorder: Secondary | ICD-10-CM | POA: Diagnosis not present

## 2020-10-09 DIAGNOSIS — Z23 Encounter for immunization: Secondary | ICD-10-CM | POA: Diagnosis not present

## 2020-10-09 DIAGNOSIS — J449 Chronic obstructive pulmonary disease, unspecified: Secondary | ICD-10-CM | POA: Diagnosis not present

## 2020-10-09 DIAGNOSIS — R4582 Worries: Secondary | ICD-10-CM | POA: Diagnosis not present

## 2020-10-09 DIAGNOSIS — E7849 Other hyperlipidemia: Secondary | ICD-10-CM | POA: Diagnosis not present

## 2020-10-09 DIAGNOSIS — F17210 Nicotine dependence, cigarettes, uncomplicated: Secondary | ICD-10-CM | POA: Diagnosis not present

## 2021-02-04 DIAGNOSIS — E079 Disorder of thyroid, unspecified: Secondary | ICD-10-CM | POA: Diagnosis not present

## 2021-02-04 DIAGNOSIS — E782 Mixed hyperlipidemia: Secondary | ICD-10-CM | POA: Diagnosis not present

## 2021-02-04 DIAGNOSIS — Z0001 Encounter for general adult medical examination with abnormal findings: Secondary | ICD-10-CM | POA: Diagnosis not present

## 2021-02-04 DIAGNOSIS — E7849 Other hyperlipidemia: Secondary | ICD-10-CM | POA: Diagnosis not present

## 2021-02-04 DIAGNOSIS — E875 Hyperkalemia: Secondary | ICD-10-CM | POA: Diagnosis not present

## 2021-02-10 DIAGNOSIS — E7849 Other hyperlipidemia: Secondary | ICD-10-CM | POA: Diagnosis not present

## 2021-02-10 DIAGNOSIS — F411 Generalized anxiety disorder: Secondary | ICD-10-CM | POA: Diagnosis not present

## 2021-02-10 DIAGNOSIS — Z0001 Encounter for general adult medical examination with abnormal findings: Secondary | ICD-10-CM | POA: Diagnosis not present

## 2021-02-10 DIAGNOSIS — J449 Chronic obstructive pulmonary disease, unspecified: Secondary | ICD-10-CM | POA: Diagnosis not present

## 2021-02-10 DIAGNOSIS — I700 Atherosclerosis of aorta: Secondary | ICD-10-CM | POA: Diagnosis not present

## 2021-02-10 DIAGNOSIS — D692 Other nonthrombocytopenic purpura: Secondary | ICD-10-CM | POA: Diagnosis not present

## 2021-02-10 DIAGNOSIS — J441 Chronic obstructive pulmonary disease with (acute) exacerbation: Secondary | ICD-10-CM | POA: Diagnosis not present

## 2021-02-10 DIAGNOSIS — R4582 Worries: Secondary | ICD-10-CM | POA: Diagnosis not present

## 2021-03-19 DIAGNOSIS — F17210 Nicotine dependence, cigarettes, uncomplicated: Secondary | ICD-10-CM | POA: Diagnosis not present

## 2021-03-19 DIAGNOSIS — Z681 Body mass index (BMI) 19 or less, adult: Secondary | ICD-10-CM | POA: Diagnosis not present

## 2021-03-19 DIAGNOSIS — M545 Low back pain, unspecified: Secondary | ICD-10-CM | POA: Diagnosis not present

## 2021-03-28 DIAGNOSIS — M545 Low back pain, unspecified: Secondary | ICD-10-CM | POA: Diagnosis not present

## 2021-03-28 DIAGNOSIS — R109 Unspecified abdominal pain: Secondary | ICD-10-CM | POA: Diagnosis not present

## 2021-03-28 DIAGNOSIS — Z681 Body mass index (BMI) 19 or less, adult: Secondary | ICD-10-CM | POA: Diagnosis not present

## 2021-03-28 DIAGNOSIS — E039 Hypothyroidism, unspecified: Secondary | ICD-10-CM | POA: Diagnosis not present

## 2021-03-28 DIAGNOSIS — F17210 Nicotine dependence, cigarettes, uncomplicated: Secondary | ICD-10-CM | POA: Diagnosis not present

## 2021-03-31 DIAGNOSIS — Q446 Cystic disease of liver: Secondary | ICD-10-CM | POA: Diagnosis not present

## 2021-03-31 DIAGNOSIS — E875 Hyperkalemia: Secondary | ICD-10-CM | POA: Diagnosis not present

## 2021-03-31 DIAGNOSIS — E559 Vitamin D deficiency, unspecified: Secondary | ICD-10-CM | POA: Diagnosis not present

## 2021-03-31 DIAGNOSIS — E079 Disorder of thyroid, unspecified: Secondary | ICD-10-CM | POA: Diagnosis not present

## 2021-03-31 DIAGNOSIS — K2100 Gastro-esophageal reflux disease with esophagitis, without bleeding: Secondary | ICD-10-CM | POA: Diagnosis not present

## 2021-04-06 DIAGNOSIS — M5126 Other intervertebral disc displacement, lumbar region: Secondary | ICD-10-CM | POA: Diagnosis not present

## 2021-04-06 DIAGNOSIS — M47816 Spondylosis without myelopathy or radiculopathy, lumbar region: Secondary | ICD-10-CM | POA: Diagnosis not present

## 2021-04-06 DIAGNOSIS — M545 Low back pain, unspecified: Secondary | ICD-10-CM | POA: Diagnosis not present

## 2021-04-13 DIAGNOSIS — R030 Elevated blood-pressure reading, without diagnosis of hypertension: Secondary | ICD-10-CM | POA: Diagnosis not present

## 2021-04-13 DIAGNOSIS — S22080A Wedge compression fracture of T11-T12 vertebra, initial encounter for closed fracture: Secondary | ICD-10-CM | POA: Diagnosis not present

## 2021-04-22 ENCOUNTER — Other Ambulatory Visit: Payer: Self-pay | Admitting: Neurosurgery

## 2021-04-23 ENCOUNTER — Other Ambulatory Visit: Payer: Self-pay | Admitting: Neurosurgery

## 2021-04-23 NOTE — Progress Notes (Addendum)
Surgical Instructions ? ? ? Your procedure is scheduled on Tuesday, March 21st, 2023. ? ? Report to [facility] Main Entrance "A" at 09:15 A.M., then check in with the Admitting office. ? Call this number if you have problems the morning of surgery: ? [phone] ? ? If you have any questions prior to your surgery date call [phone]: Open Monday-Friday 8am-4pm ? ? ? Remember: ? Do not eat after midnight the night before your surgery ? ?You may drink clear liquids until 08:15 the morning of your surgery.   ?Clear liquids allowed are: Water, Non-Citrus Juices (without pulp), Carbonated Beverages, Clear Tea, Black Coffee ONLY (NO MILK, CREAM OR POWDERED CREAMER of any kind), and Gatorade ?  ? Take these medicines the morning of surgery with A SIP OF WATER:  ? ?atorvastatin (LIPITOR)  ?omeprazole (PRILOSEC)  ? ?If needed: ? ?acetaminophen (TYLENOL)  ?ALPRAZolam Duanne Moron)  ? ?Hold Aspirin 7 days prior surgery per MD. ? ?As of today, STOP taking any Aspirin (unless otherwise instructed by your surgeon) Aleve, Naproxen, Ibuprofen, Motrin, Advil, Goody's, BC's, all herbal medications, fish oil, and all vitamins. ? ? ?The day of surgery: ?         ?Do not wear jewelry or makeup ?Do not wear lotions, powders, perfumes, or deodorant. ?Do not shave 48 hours prior to surgery.   ?Do not bring valuables to the hospital. ?Do not wear nail polish, gel polish, artificial nails, or any other type of covering on natural nails (fingers and toes) ?If you have artificial nails or gel coating that need to be removed by a nail salon, please have this removed prior to surgery. Artificial nails or gel coating may interfere with anesthesia's ability to adequately monitor your vital signs. ? ? ?[facility] is not responsible for any belongings or valuables. .  ? ?Do NOT Smoke (Tobacco/Vaping)  24 hours prior to your procedure ? ?If you use a CPAP at night, you may bring your mask for your overnight stay. ?  ?Contacts, glasses, hearing aids,  dentures or partials may not be worn into surgery, please bring cases for these belongings ?  ?For patients admitted to the hospital, discharge time will be determined by your treatment team. ?  ?Patients discharged the day of surgery will not be allowed to drive home, and someone needs to stay with them for 24 hours. ? ?NO VISITORS WILL BE ALLOWED IN PRE-OP WHERE PATIENTS ARE PREPPED FOR SURGERY.  ONLY 1 SUPPORT PERSON MAY BE PRESENT IN THE WAITING ROOM WHILE YOU ARE IN SURGERY.  IF YOU ARE TO BE ADMITTED, ONCE YOU ARE IN YOUR ROOM YOU WILL BE ALLOWED TWO (2) VISITORS. 1 (ONE) VISITOR MAY STAY OVERNIGHT BUT MUST ARRIVE TO THE ROOM BY 8pm.  Minor children may have two parents present. Special consideration for safety and communication needs will be reviewed on a case by case basis. ? ?Special instructions:   ? ?Oral Hygiene is also important to reduce your risk of infection.  Remember - BRUSH YOUR TEETH THE MORNING OF SURGERY WITH YOUR REGULAR TOOTHPASTE ? ? ?- Preparing For Surgery ? ?Before surgery, you can play an important role. Because skin is not sterile, your skin needs to be as free of germs as possible. You can reduce the number of germs on your skin by washing with CHG (chlorahexidine gluconate) Soap before surgery.  CHG is an antiseptic cleaner which kills germs and bonds with the skin to continue killing germs even after washing.   ? ? ?  Please do not use if you have an allergy to CHG or antibacterial soaps. If your skin becomes reddened/irritated stop using the CHG.  ?Do not shave (including legs and underarms) for at least 48 hours prior to first CHG shower. It is OK to shave your face. ? ?Please follow these instructions carefully. ?  ? ? Shower the NIGHT BEFORE SURGERY and the MORNING OF SURGERY with CHG Soap.  ? If you chose to wash your hair, wash your hair first as usual with your normal shampoo. After you shampoo, rinse your hair and body thoroughly to remove the shampoo.  Then [employer] and genitals (private parts) with your normal soap and rinse thoroughly to remove soap. ? ?After that Use CHG Soap as you would any other liquid soap. You can apply CHG directly to the skin and wash gently with a scrungie or a clean washcloth.  ? ?Apply the CHG Soap to your body ONLY FROM THE NECK DOWN.  Do not use on open wounds or open sores. Avoid contact with your eyes, ears, mouth and genitals (private parts). Wash Face and genitals (private parts)  with your normal soap.  ? ?Wash thoroughly, paying special attention to the area where your surgery will be performed. ? ?Thoroughly rinse your body with warm water from the neck down. ? ?DO NOT shower/wash with your normal soap after using and rinsing off the CHG Soap. ? ?Pat yourself dry with a CLEAN TOWEL. ? ?Wear CLEAN PAJAMAS to bed the night before surgery ? ?Place CLEAN SHEETS on your bed the night before your surgery ? ?DO NOT SLEEP WITH PETS. ? ? ?Day of Surgery: ? ?Take a shower with CHG soap. ?Wear Clean/Comfortable clothing the morning of surgery ?Do not apply any deodorants/lotions.   ?Remember to brush your teeth WITH YOUR REGULAR TOOTHPASTE. ? ?  ?Please read over the following fact sheets that you were given.   ?

## 2021-04-24 ENCOUNTER — Encounter (HOSPITAL_COMMUNITY)
Admission: RE | Admit: 2021-04-24 | Discharge: 2021-04-24 | Disposition: A | Discharge status: Home / Self Care | Payer: Medicare Other | Source: Ambulatory Visit | Attending: Neurosurgery | Admitting: Neurosurgery

## 2021-04-24 ENCOUNTER — Other Ambulatory Visit: Payer: Self-pay

## 2021-04-24 ENCOUNTER — Encounter (HOSPITAL_COMMUNITY): Payer: Self-pay

## 2021-04-24 ENCOUNTER — Other Ambulatory Visit (HOSPITAL_COMMUNITY): Payer: Self-pay

## 2021-04-24 VITALS — BP 157/87 | HR 99 | Temp 97.7°F | Resp 18 | Ht 66.0 in | Wt 107.9 lb

## 2021-04-24 DIAGNOSIS — E119 Type 2 diabetes mellitus without complications: Secondary | ICD-10-CM | POA: Insufficient documentation

## 2021-04-24 DIAGNOSIS — Z01818 Encounter for other preprocedural examination: Secondary | ICD-10-CM | POA: Diagnosis not present

## 2021-04-24 HISTORY — DX: Malignant (primary) neoplasm, unspecified: C80.1

## 2021-04-24 HISTORY — DX: Dyspnea, unspecified: R06.00

## 2021-04-24 HISTORY — DX: Type 2 diabetes mellitus without complications: E11.9

## 2021-04-24 HISTORY — DX: Anxiety disorder, unspecified: F41.9

## 2021-04-24 HISTORY — DX: Gastro-esophageal reflux disease without esophagitis: K21.9

## 2021-04-24 HISTORY — DX: Unspecified osteoarthritis, unspecified site: M19.90

## 2021-04-24 LAB — HEMOGLOBIN A1C
Hgb A1c MFr Bld: 5.7 % — ABNORMAL HIGH (ref 4.8–5.6)
Mean Plasma Glucose: 116.89 mg/dL

## 2021-04-24 LAB — COMPREHENSIVE METABOLIC PANEL
ALT: 15 U/L (ref 0–44)
AST: 24 U/L (ref 15–41)
Albumin: 3.3 g/dL — ABNORMAL LOW (ref 3.5–5.0)
Alkaline Phosphatase: 116 U/L (ref 38–126)
Anion gap: 10 (ref 5–15)
BUN: 8 mg/dL (ref 8–23)
CO2: 32 mmol/L (ref 22–32)
Calcium: 9.7 mg/dL (ref 8.9–10.3)
Chloride: 99 mmol/L (ref 98–111)
Creatinine, Ser: 0.79 mg/dL (ref 0.44–1.00)
GFR, Estimated: >60 mL/min (ref >60)
Glucose, Bld: 87 mg/dL (ref 70–99)
Potassium: 3.6 mmol/L (ref 3.5–5.1)
Sodium: 141 mmol/L (ref 135–145)
Total Bilirubin: 0.6 mg/dL (ref 0.3–1.2)
Total Protein: 6.7 g/dL (ref 6.5–8.1)

## 2021-04-24 LAB — CBC
HCT: 46.8 % — ABNORMAL HIGH (ref 36.0–46.0)
Hemoglobin: 14.7 g/dL (ref 12.0–15.0)
MCH: 29.4 pg (ref 26.0–34.0)
MCHC: 31.4 g/dL (ref 30.0–36.0)
MCV: 93.6 fL (ref 80.0–100.0)
Platelets: 242 10*3/uL (ref 150–400)
RBC: 5 MIL/uL (ref 3.87–5.11)
RDW: 16.6 % — ABNORMAL HIGH (ref 11.5–15.5)
WBC: 8.1 10*3/uL (ref 4.0–10.5)
nRBC: 0 % (ref 0.0–0.2)

## 2021-04-24 LAB — GLUCOSE, CAPILLARY: Glucose-Capillary: 104 mg/dL — ABNORMAL HIGH (ref 70–99)

## 2021-04-24 LAB — SURGICAL PCR SCREEN
MRSA, PCR: NEGATIVE
Staphylococcus aureus: NEGATIVE

## 2021-04-24 NOTE — Progress Notes (Addendum)
PCP - Gar Ponto, MD ?Cardiologist - denies ? ?PPM/ICD - denies ?Device Orders - n/a ?Rep Notified - n/a ? ?Chest x-ray - n/a ?EKG - 04/24/2021 ?Stress Test - denies ?ECHO - denies ?Cardiac Cath - denies ? ?Sleep Study - denies ?CPAP - n/a ? ?Fasting Blood Sugar - patient is not checking CBG and is not taking any medicine for diabetes. ?CBG today - 104 ?A1C - done in PAT on 04/24/2021 ? ?Blood Thinner Instructions: n/a ? ?Aspirin Instructions: last dose - 04/22/2021 per patient ? ?Patient was instructed: As of today, STOP taking any Aspirin (unless otherwise instructed by your surgeon) Aleve, Naproxen, Ibuprofen, Motrin, Advil, Goody's, BC's, all herbal medications, fish oil, and all vitamins. ? ?ERAS Protcol - yes ?PRE-SURGERY Ensure or G2- n/a ? ?COVID TEST- no - ambulatory surgery ? ? ?Anesthesia review: yes - abnormal EKG; pre-op clearance  ? ?Patient denies shortness of breath, fever, cough and chest pain at PAT appointment ? ? ?All instructions explained to the patient, with a verbal understanding of the material. Patient agrees to go over the instructions while at home for a better understanding. Patient also instructed to self quarantine after being tested for COVID-19. The opportunity to ask questions was provided. ?  ?

## 2021-04-27 NOTE — Anesthesia Preprocedure Evaluation (Addendum)
Anesthesia Evaluation  ?Patient identified by MRN, date of birth, ID band ?Patient awake ? ? ? ?Reviewed: ?Allergy & Precautions, H&P , NPO status , Patient's Chart, lab work & pertinent test results ? ?Airway ?Mallampati: II ? ?TM Distance: >3 FB ?Neck ROM: Full ? ? ? Dental ?no notable dental hx. ?(+) Upper Dentures, Lower Dentures, Dental Advisory Given ?  ?Pulmonary ?Current Smoker and Patient abstained from smoking.,  ?  ?Pulmonary exam normal ?breath sounds clear to auscultation ? ? ? ? ? ? Cardiovascular ?negative cardio ROS ? ? ?Rhythm:Regular Rate:Normal ? ? ?  ?Neuro/Psych ?Anxiety negative neurological ROS ?   ? GI/Hepatic ?Neg liver ROS, GERD  Medicated,  ?Endo/Other  ?diabetes, Well Controlled ? Renal/GU ?negative Renal ROS  ?negative genitourinary ?  ?Musculoskeletal ? ?(+) Arthritis , Osteoarthritis,   ? Abdominal ?  ?Peds ? Hematology ?negative hematology ROS ?(+)   ?Anesthesia Other Findings ? ? Reproductive/Obstetrics ?negative OB ROS ? ?  ? ? ? ? ? ? ? ? ? ? ? ? ? ?  ?  ? ? ? ? ? ? ? ?Anesthesia Physical ?Anesthesia Plan ? ?ASA: 3 ? ?Anesthesia Plan: General  ? ?Post-op Pain Management: Tylenol PO (pre-op)*  ? ?Induction: Intravenous ? ?PONV Risk Score and Plan: 3 and Ondansetron, Dexamethasone and Treatment may vary due to age or medical condition ? ?Airway Management Planned: Oral ETT ? ?Additional Equipment:  ? ?Intra-op Plan:  ? ?Post-operative Plan: Extubation in OR ? ?Informed Consent: I have reviewed the patients History and Physical, chart, labs and discussed the procedure including the risks, benefits and alternatives for the proposed anesthesia with the patient or authorized representative who has indicated his/her understanding and acceptance.  ? ? ? ?Dental advisory given ? ?Plan Discussed with: CRNA ? ?Anesthesia Plan Comments: (Patient has clearance from PCP Dr. Gar Ponto stating she is low risk for surgery and may hold aspirin 7 days.  Copy on  chart. ? ?Preop EKG 04/24/2021 shows sinus tach at rate of 103 with bifascicular block.  Chart review shows that bifascicular block was also present on EKG dated 11/17/2009 in [place]. ? ?Preop labs reviewed, unremarkable.  Non-insulin-dependent DM2 well-controlled with A1c 5.7.)  ? ? ? ? ? ?Anesthesia Quick Evaluation ? ?

## 2021-04-28 ENCOUNTER — Ambulatory Visit (HOSPITAL_COMMUNITY)
Admission: RE | Admit: 2021-04-28 | Discharge: 2021-04-28 | Disposition: A | Discharge status: Home / Self Care | Payer: Medicare Other | Attending: Neurosurgery | Admitting: Neurosurgery

## 2021-04-28 ENCOUNTER — Ambulatory Visit (HOSPITAL_BASED_OUTPATIENT_CLINIC_OR_DEPARTMENT_OTHER): Payer: Medicare Other | Admitting: Anesthesiology

## 2021-04-28 ENCOUNTER — Ambulatory Visit (HOSPITAL_COMMUNITY): Payer: Medicare Other | Admitting: Physician Assistant

## 2021-04-28 ENCOUNTER — Encounter (HOSPITAL_COMMUNITY)
Admission: RE | Disposition: A | Discharge status: Home / Self Care | Payer: Self-pay | Source: Home / Self Care | Attending: Neurosurgery

## 2021-04-28 ENCOUNTER — Ambulatory Visit (HOSPITAL_COMMUNITY): Payer: Medicare Other

## 2021-04-28 ENCOUNTER — Encounter (HOSPITAL_COMMUNITY): Payer: Self-pay

## 2021-04-28 ENCOUNTER — Other Ambulatory Visit: Payer: Self-pay

## 2021-04-28 DIAGNOSIS — M4854XA Collapsed vertebra, not elsewhere classified, thoracic region, initial encounter for fracture: Secondary | ICD-10-CM

## 2021-04-28 DIAGNOSIS — F419 Anxiety disorder, unspecified: Secondary | ICD-10-CM | POA: Diagnosis not present

## 2021-04-28 DIAGNOSIS — E119 Type 2 diabetes mellitus without complications: Secondary | ICD-10-CM | POA: Diagnosis not present

## 2021-04-28 DIAGNOSIS — M1990 Unspecified osteoarthritis, unspecified site: Secondary | ICD-10-CM | POA: Diagnosis not present

## 2021-04-28 DIAGNOSIS — S22080A Wedge compression fracture of T11-T12 vertebra, initial encounter for closed fracture: Secondary | ICD-10-CM | POA: Diagnosis not present

## 2021-04-28 DIAGNOSIS — F17210 Nicotine dependence, cigarettes, uncomplicated: Secondary | ICD-10-CM | POA: Diagnosis not present

## 2021-04-28 DIAGNOSIS — K219 Gastro-esophageal reflux disease without esophagitis: Secondary | ICD-10-CM | POA: Diagnosis not present

## 2021-04-28 DIAGNOSIS — Z9889 Other specified postprocedural states: Secondary | ICD-10-CM | POA: Diagnosis not present

## 2021-04-28 DIAGNOSIS — M8088XA Other osteoporosis with current pathological fracture, vertebra(e), initial encounter for fracture: Secondary | ICD-10-CM | POA: Diagnosis not present

## 2021-04-28 DIAGNOSIS — Z8501 Personal history of malignant neoplasm of esophagus: Secondary | ICD-10-CM | POA: Diagnosis not present

## 2021-04-28 HISTORY — PX: KYPHOPLASTY: SHX5884

## 2021-04-28 LAB — GLUCOSE, CAPILLARY
Glucose-Capillary: 130 mg/dL — ABNORMAL HIGH (ref 70–99)
Glucose-Capillary: 88 mg/dL (ref 70–99)
Glucose-Capillary: 80 mg/dL (ref 70–99)

## 2021-04-28 SURGERY — KYPHOPLASTY
Anesthesia: General | Site: Back

## 2021-04-28 MED ORDER — ACETAMINOPHEN 500 MG PO TABS: 1000.0000 mg | ORAL_TABLET | Freq: Once | ORAL | Status: AC

## 2021-04-28 MED ORDER — INSULIN ASPART 100 UNIT/ML IJ SOLN: 0.0000 [IU] | INTRAMUSCULAR | Status: DC | PRN

## 2021-04-28 MED ORDER — LIDOCAINE-EPINEPHRINE 1 %-1:100000 IJ SOLN
INTRAMUSCULAR | Status: DC | PRN
  Administered 2021-04-28: 9 mL

## 2021-04-28 MED ORDER — PHENYLEPHRINE 40 MCG/ML (10ML) SYRINGE FOR IV PUSH (FOR BLOOD PRESSURE SUPPORT)
PREFILLED_SYRINGE | INTRAVENOUS | Status: DC | PRN
  Administered 2021-04-28: 80 ug via INTRAVENOUS

## 2021-04-28 MED ORDER — PHENYLEPHRINE HCL-NACL 20-0.9 MG/250ML-% IV SOLN
INTRAVENOUS | Status: DC | PRN
Start: 2021-04-28 — End: 2021-04-28
  Administered 2021-04-28: 30 ug/min via INTRAVENOUS

## 2021-04-28 MED ORDER — LIDOCAINE-EPINEPHRINE 1 %-1:100000 IJ SOLN
INTRAMUSCULAR | Status: AC
  Filled 2021-04-28: qty 1

## 2021-04-28 MED ORDER — ONDANSETRON HCL 4 MG/2ML IJ SOLN
INTRAMUSCULAR | Status: DC | PRN
  Administered 2021-04-28: 4 mg via INTRAVENOUS

## 2021-04-28 MED ORDER — PROPOFOL 10 MG/ML IV BOLUS
INTRAVENOUS | Status: DC | PRN
  Administered 2021-04-28: 50 mg via INTRAVENOUS

## 2021-04-28 MED ORDER — 0.9 % SODIUM CHLORIDE (POUR BTL) OPTIME
TOPICAL | Status: DC | PRN
  Administered 2021-04-28: 1000 mL

## 2021-04-28 MED ORDER — ORAL CARE MOUTH RINSE: 15.0000 mL | Freq: Once | OROMUCOSAL | Status: AC

## 2021-04-28 MED ORDER — LIDOCAINE 2% (20 MG/ML) 5 ML SYRINGE
INTRAMUSCULAR | Status: DC | PRN
  Administered 2021-04-28: 60 mg via INTRAVENOUS

## 2021-04-28 MED ORDER — HYDROCODONE-ACETAMINOPHEN 5-325 MG PO TABS: 1.0000 | ORAL_TABLET | ORAL | 0 refills | Status: AC | PRN

## 2021-04-28 MED ORDER — BUPIVACAINE HCL (PF) 0.5 % IJ SOLN
INTRAMUSCULAR | Status: AC
  Filled 2021-04-28: qty 30

## 2021-04-28 MED ORDER — BUPIVACAINE HCL (PF) 0.5 % IJ SOLN
INTRAMUSCULAR | Status: DC | PRN
  Administered 2021-04-28: 6 mL

## 2021-04-28 MED ORDER — ACETAMINOPHEN 500 MG PO TABS
ORAL_TABLET | ORAL | Status: AC
  Administered 2021-04-28: 1000 mg via ORAL
  Filled 2021-04-28: qty 2

## 2021-04-28 MED ORDER — DEXAMETHASONE SODIUM PHOSPHATE 10 MG/ML IJ SOLN
INTRAMUSCULAR | Status: DC | PRN
  Administered 2021-04-28: 10 mg via INTRAVENOUS

## 2021-04-28 MED ORDER — IOHEXOL 300 MG/ML  SOLN
INTRAMUSCULAR | Status: DC | PRN
  Administered 2021-04-28: 100 mL

## 2021-04-28 MED ORDER — ROCURONIUM BROMIDE 10 MG/ML (PF) SYRINGE
PREFILLED_SYRINGE | INTRAVENOUS | Status: DC | PRN
  Administered 2021-04-28: 40 mg via INTRAVENOUS
  Administered 2021-04-28: 20 mg via INTRAVENOUS

## 2021-04-28 MED ORDER — CEFAZOLIN SODIUM-DEXTROSE 2-3 GM-%(50ML) IV SOLR
INTRAVENOUS | Status: DC | PRN
  Administered 2021-04-28: 2 g via INTRAVENOUS

## 2021-04-28 MED ORDER — CEFAZOLIN SODIUM 1 G IJ SOLR
INTRAMUSCULAR | Status: AC
  Filled 2021-04-28: qty 20

## 2021-04-28 MED ORDER — CHLORHEXIDINE GLUCONATE 0.12 % MT SOLN
15.0000 mL | Freq: Once | OROMUCOSAL | Status: AC
  Administered 2021-04-28: 15 mL via OROMUCOSAL
  Filled 2021-04-28: qty 15

## 2021-04-28 MED ORDER — FENTANYL CITRATE (PF) 250 MCG/5ML IJ SOLN
INTRAMUSCULAR | Status: AC
  Filled 2021-04-28: qty 5

## 2021-04-28 MED ORDER — LACTATED RINGERS IV SOLN: INTRAVENOUS | Status: DC

## 2021-04-28 MED ORDER — FENTANYL CITRATE (PF) 250 MCG/5ML IJ SOLN
INTRAMUSCULAR | Status: DC | PRN
  Administered 2021-04-28: 50 ug via INTRAVENOUS

## 2021-04-28 MED ORDER — SUGAMMADEX SODIUM 200 MG/2ML IV SOLN
INTRAVENOUS | Status: DC | PRN
  Administered 2021-04-28: 200 mg via INTRAVENOUS

## 2021-04-28 SURGICAL SUPPLY — 42 items
BAG COUNTER SPONGE SURGICOUNT (BAG) ×2 IMPLANT
BENZOIN TINCTURE PRP APPL 2/3 (GAUZE/BANDAGES/DRESSINGS) IMPLANT
BLADE CLIPPER SURG (BLADE) IMPLANT
CEMENT KYPHON CX01A KIT/MIXER (Cement) ×1 IMPLANT
CNTNR URN SCR LID CUP LEK RST (MISCELLANEOUS) IMPLANT
CONT SPEC 4OZ STRL OR WHT (MISCELLANEOUS) ×1
DECANTER SPIKE VIAL GLASS SM (MISCELLANEOUS) ×1 IMPLANT
DERMABOND ADVANCED (GAUZE/BANDAGES/DRESSINGS) ×1
DERMABOND ADVANCED .7 DNX12 (GAUZE/BANDAGES/DRESSINGS) IMPLANT
DRAPE C-ARM 42X72 X-RAY (DRAPES) ×2 IMPLANT
DRAPE INCISE IOBAN 66X45 STRL (DRAPES) ×1 IMPLANT
DRAPE LAPAROTOMY 100X72X124 (DRAPES) ×2 IMPLANT
DRAPE SURG 17X23 STRL (DRAPES) ×1 IMPLANT
DRAPE WARM FLUID 44X44 (DRAPES) ×1 IMPLANT
DRSG AQUACEL AG 3.5X4 (GAUZE/BANDAGES/DRESSINGS) ×1 IMPLANT
DRSG OPSITE POSTOP 3X4 (GAUZE/BANDAGES/DRESSINGS) ×2 IMPLANT
DRSG TELFA 3X8 NADH (GAUZE/BANDAGES/DRESSINGS) ×2 IMPLANT
DURAPREP 26ML APPLICATOR (WOUND CARE) ×2 IMPLANT
GAUZE 4X4 16PLY ~~LOC~~+RFID DBL (SPONGE) ×1 IMPLANT
GLOVE SURG ENC MOIS LTX SZ8 (GLOVE) ×2 IMPLANT
GLOVE SURG LTX SZ7.5 (GLOVE) ×2 IMPLANT
GLOVE SURG UNDER POLY LF SZ7.5 (GLOVE) ×2 IMPLANT
GLOVE SURG UNDER POLY LF SZ8.5 (GLOVE) ×2 IMPLANT
GOWN STRL REUS W/ TWL LRG LVL3 (GOWN DISPOSABLE) IMPLANT
GOWN STRL REUS W/ TWL XL LVL3 (GOWN DISPOSABLE) ×2 IMPLANT
GOWN STRL REUS W/TWL 2XL LVL3 (GOWN DISPOSABLE) IMPLANT
GOWN STRL REUS W/TWL LRG LVL3 (GOWN DISPOSABLE) ×2
GOWN STRL REUS W/TWL XL LVL3 (GOWN DISPOSABLE) ×2
KIT BASIN OR (CUSTOM PROCEDURE TRAY) ×2 IMPLANT
KIT TURNOVER KIT B (KITS) ×2 IMPLANT
MARKER SKIN DUAL TIP RULER LAB (MISCELLANEOUS) ×1 IMPLANT
NEEDLE HYPO 22GX1.5 SAFETY (NEEDLE) ×2 IMPLANT
NS IRRIG 1000ML POUR BTL (IV SOLUTION) ×2 IMPLANT
PACK LAMINECTOMY NEURO (CUSTOM PROCEDURE TRAY) ×2 IMPLANT
PAD ARMBOARD 7.5X6 YLW CONV (MISCELLANEOUS) ×6 IMPLANT
PAD DRESSING TELFA 3X8 NADH (GAUZE/BANDAGES/DRESSINGS) IMPLANT
STRIP CLOSURE SKIN 1/4X4 (GAUZE/BANDAGES/DRESSINGS) ×1 IMPLANT
SUT VICRYL RAPIDE 3 0 (SUTURE) ×3 IMPLANT
TOWEL GREEN STERILE (TOWEL DISPOSABLE) ×2 IMPLANT
TOWEL GREEN STERILE FF (TOWEL DISPOSABLE) ×2 IMPLANT
TRAY KYPHOPAK 15/3 ONESTEP 1ST (MISCELLANEOUS) ×1 IMPLANT
WATER STERILE IRR 1000ML POUR (IV SOLUTION) ×2 IMPLANT

## 2021-04-28 NOTE — Anesthesia Procedure Notes (Signed)
Procedure Name: Intubation ?Date/Time: 04/28/2021 11:05 AM ?Performed by: Vonna Drafts, CRNA ?Pre-anesthesia Checklist: Patient identified, Emergency Drugs available, Suction available and Patient being monitored ?Patient Re-evaluated:Patient Re-evaluated prior to induction ?Oxygen Delivery Method: Circle system utilized ?Preoxygenation: Pre-oxygenation with 100% oxygen ?Induction Type: IV induction ?Ventilation: Mask ventilation without difficulty ?Laryngoscope Size: Mac and 3 ?Grade View: Grade I ?Tube type: Oral ?Tube size: 7.0 mm ?Number of attempts: 1 ?Airway Equipment and Method: Stylet and Oral airway ?Placement Confirmation: ETT inserted through vocal cords under direct vision, positive ETCO2 and breath sounds checked- equal and bilateral ?Secured at: 21 cm ?Tube secured with: Tape ?Dental Injury: Teeth and Oropharynx as per pre-operative assessment  ? ? ? ? ?

## 2021-04-28 NOTE — Transfer of Care (Signed)
Immediate Anesthesia Transfer of Care Note ? ?Patient: Tiffany Mcdaniel ? ?Procedure(s) Performed: KYPHOPLASTY THORACC TWELVE (Back) ? ?Patient Location: PACU ? ?Anesthesia Type:General ? ?Level of Consciousness: drowsy ? ?Airway & Oxygen Therapy: Patient Spontanous Breathing and Patient connected to face mask oxygen ? ?Post-op Assessment: Report given to RN and Post -op Vital signs reviewed and stable ? ?Post vital signs: Reviewed and stable ? ?Last Vitals:  ?Vitals Value Taken Time  ?BP    ?Temp    ?Pulse 84 04/28/21 1223  ?Resp 21 04/28/21 1221  ?SpO2 100 % 04/28/21 1223  ?Vitals shown include unvalidated device data. ? ?Last Pain:  ?Vitals:  ? 04/28/21 0908  ?PainSc: 0-No pain  ?   ? ?Patients Stated Pain Goal: 0 (04/28/21 0908) ? ?Complications: No notable events documented. ?

## 2021-04-28 NOTE — Op Note (Signed)
PREOP DIAGNOSIS: T12 compression fx  ? ?POSTOP DIAGNOSIS: Same ? ?PROCEDURE: ?T12 Kyphoplasty via bipedicular approach ? ?SURGEON: Dr. Duffy Rhody, MD ? ?ASSISTANT: None ? ?ANESTHESIA: GETA ? ?EBL: Minimal ? ?SPECIMENS: T12 vertebral body ? ?DRAINS: None ? ?COMPLICATIONS: None immediate ? ?CONDITION: Hemodynamically stable to PCAU ? ?HISTORY: ?Tiffany Mcdaniel is a 81 y.o. yo female with remote history of esophageal cancer who developed pain in her lower back.  She was found to have a T12 compression fracture.  Her symptoms worsened to the point where she had difficulty getting out of bed and a subsequent MRI showed worsening of her compression fracture with significant STIR signal change. She and her family wished to proceed with palliative kyphoplasty. ? ?PROCEDURE IN DETAIL: ?After informed consent was obtained and witnessed, the patient was brought to the operating room. After induction of anesthesia, the patient was positioned on the operative table in the prone position. All pressure points were meticulously padded. Fluoroscopy was used to mark out the projection of the T12 pedicles on the skin. Skin incision was then marked out and prepped and draped in the usual sterile fashion. ? ?After time out was conducted, bilateral stab incisions were made and Jamshidi needles were introduced. Under AP and lateral fluoroscopic guidance, bilateral T12 pedicles were canulated. Drill was then used to create a channel and the kyphoplasty balloon was placed and expanded to create a cavity. Approximately 2 ml of PMMA cement was injected in each side under fluoroscopy, taking care to preserve the posterior cortex. The cement introducers were removed, replaced with the trocars, and the Jamshidi needles removed. ? ?Stab incisions were then closed with 3-0 vicryl suture and standard skin glue. The patient was then transferred to the stretcher and taken to the PACU in stable hemodynamic condition. ? ?At the end of the case all  sponge, needle, and instrument counts were correct. ? ? ? ? ?

## 2021-04-28 NOTE — H&P (Signed)
CC: T12 compression fracture ? ?HPI: ? ?  ? ?Patient is a 81 y.o. female with remote history of esophageal cancer who developed pain in her lower back.  She was found to have a T12 compression fracture.  Her symptoms worsened to the point where she had difficulty getting out of bed and a subsequent MRI showed worsening of her compression fracture with significant STIR signal change.  Her pain was more so in her sacral area rather than her mid back so a sacral MRI was performed which showed no sacral insufficiency fracture. ? ? ? ?There are no problems to display for this patient. ? ?Past Medical History:  ?Diagnosis Date  ? Anxiety   ? Arthritis   ? Cancer [facility])   ? Diabetes mellitus without complication ([place])   ? Dyspnea   ? GERD (gastroesophageal reflux disease)   ?  ?Past Surgical History:  ?Procedure Laterality Date  ? APPENDECTOMY    ? EYE SURGERY    ? bilateral cataracts  ? TUBAL LIGATION    ?  ?Medications Prior to Admission  ?Medication Sig Dispense Refill Last Dose  ? acetaminophen (TYLENOL) 500 MG tablet Take 500 mg by mouth every 6 (six) hours as needed (pain.).   04/27/2021  ? ALPRAZolam (XANAX) 0.5 MG tablet Take 0.5 mg by mouth every 8 (eight) hours as needed for anxiety.   04/28/2021  ? Aromatic Inhalants (VAPOR INHALER IN) Inhale 1 spray into the lungs as needed (congestion.).   Past Week  ? aspirin 81 MG chewable tablet Chew by mouth daily.   Past Week  ? atorvastatin (LIPITOR) 10 MG tablet Take 10 mg by mouth in the morning.   04/28/2021  ? Cholecalciferol (VITAMIN D3) 125 MCG (5000 UT) TABS Take 5,000 Units by mouth in the morning.   Past Week  ? omeprazole (PRILOSEC) 40 MG capsule Take 40 mg by mouth in the morning.   04/28/2021  ? ?No Known Allergies  ?Social History  ? ?Tobacco Use  ? Smoking status: Every Day  ?  Packs/day: 1.00  ?  Types: Cigarettes  ? Smokeless tobacco: Never  ?Substance Use Topics  ? Alcohol use: Not on file  ?  ?History reviewed. No pertinent family history.  ? ?Review of  Systems ?Pertinent items are noted in HPI. ? ?Objective:  ? ?Patient Vitals for the past 8 hrs: ? BP Temp Pulse Resp SpO2 Height Weight  ?04/28/21 0858 (!) 160/107 (!) 97.4 ?F (36.3 ?C) 68 17 98 % '5\' 6"'$  (1.676 m) 46.7 kg  ? ?No intake/output data recorded. ?No intake/output data recorded. ? ? ? ? ? General : Alert, cooperative, no distress, appears stated age  ? Head:  Normocephalic/atraumatic   ? Eyes: PERRL, conjunctiva/corneas clear, EOM's intact. Fundi could not be visualized ?Neck: Supple ?Chest:  Respirations unlabored ?Chest wall: no tenderness or deformity ?Heart: Regular rate and rhythm ?Abdomen: Soft, nontender and nondistended ?Extremities: warm and well-perfused ?Skin: normal turgor, color and texture ?Neurologic:  Alert, oriented x 3.  Eyes open spontaneously. PERRL, EOMI, VFC, no facial droop. V1-3 intact.  No dysarthria, tongue protrusion symmetric.  CNII-XII intact. Normal strength, sensation and reflexes throughout.  No pronator drift, full strength in legs  ?  ? ? ? ?Data ReviewCBC:  ?Lab Results  ?Component Value Date  ? WBC 8.1 04/24/2021  ? RBC 5.00 04/24/2021  ? ?BMP:  ?Lab Results  ?Component Value Date  ? GLUCOSE 87 04/24/2021  ? CO2 32 04/24/2021  ? BUN 8 04/24/2021  ?  CREATININE 0.79 04/24/2021  ? CALCIUM 9.7 04/24/2021  ? ?Radiology review:  ?MRI from February 2023 shows a T12 compression fracture with 50% loss of height and STIR signal change.  X-ray performed in clinic March 6 showed T12 compression fracture with 75% loss of height.  X-ray from March 19, 2021 showed her T12 compression fracture had only minimal loss of height. ? ?Assessment:  ? ?Active Problems: ?  * No active hospital problems. * ? ?T12 compression fracture with progression and associated pain ? ?Plan:  ? ?-Plan for palliative T12 kyphoplasty today ?Risks, benefits, alternatives, and expected convalescence were discussed with the patient and family.  The general technique of the procedure were discussed.  Risk  discussed included bleeding, pain, infection, adjacent fracture, extravasation, embolization, neurologic deficit.  All questions and concerns were answered.  She wished to proceed with surgery. ? ? ?

## 2021-04-28 NOTE — Discharge Instructions (Addendum)
Walk as much as possible No heavy lifting >10 lbs No excessive bending/twisting at the waist  

## 2021-04-28 NOTE — Anesthesia Postprocedure Evaluation (Signed)
Anesthesia Post Note ? ?Patient: [name] ? ?Procedure(s) Performed: KYPHOPLASTY THORACC TWELVE (Back) ? ?  ? ?Patient location during evaluation: PACU ?Anesthesia Type: General ?Level of consciousness: awake and alert ?Pain management: pain level controlled ?Vital Signs Assessment: post-procedure vital signs reviewed and stable ?Respiratory status: spontaneous breathing, nonlabored ventilation and respiratory function stable ?Cardiovascular status: blood pressure returned to baseline and stable ?Postop Assessment: no apparent nausea or vomiting ?Anesthetic complications: no ? ? ?No notable events documented. ? ?Last Vitals:  ?Vitals:  ? 04/28/21 1240 04/28/21 1255  ?BP: 139/64 (!) 161/85  ?Pulse: 69 83  ?Resp: 20 14  ?Temp:  (!) 36.3 ?C  ?SpO2: 100% 99%  ?  ?Last Pain:  ?Vitals:  ? 04/28/21 1255  ?PainSc: 0-No pain  ? ? ?  ?  ?  ?  ?  ?  ? ?Aquarius Latouche,W. EDMOND ? ? ? ? ?

## 2021-04-29 ENCOUNTER — Encounter (HOSPITAL_COMMUNITY): Payer: Self-pay | Admitting: Neurosurgery

## 2021-04-29 LAB — SURGICAL PATHOLOGY

## 2021-05-08 DIAGNOSIS — S22080A Wedge compression fracture of T11-T12 vertebra, initial encounter for closed fracture: Secondary | ICD-10-CM | POA: Diagnosis not present

## 2021-05-25 DIAGNOSIS — S22080A Wedge compression fracture of T11-T12 vertebra, initial encounter for closed fracture: Secondary | ICD-10-CM | POA: Diagnosis not present

## 2021-05-25 DIAGNOSIS — R2689 Other abnormalities of gait and mobility: Secondary | ICD-10-CM | POA: Diagnosis not present

## 2021-05-25 DIAGNOSIS — M2560 Stiffness of unspecified joint, not elsewhere classified: Secondary | ICD-10-CM | POA: Diagnosis not present

## 2021-05-25 DIAGNOSIS — M62830 Muscle spasm of back: Secondary | ICD-10-CM | POA: Diagnosis not present

## 2021-05-25 DIAGNOSIS — R293 Abnormal posture: Secondary | ICD-10-CM | POA: Diagnosis not present

## 2021-05-25 DIAGNOSIS — M6281 Muscle weakness (generalized): Secondary | ICD-10-CM | POA: Diagnosis not present

## 2021-05-26 DIAGNOSIS — M810 Age-related osteoporosis without current pathological fracture: Secondary | ICD-10-CM | POA: Diagnosis not present

## 2021-05-26 DIAGNOSIS — S22080A Wedge compression fracture of T11-T12 vertebra, initial encounter for closed fracture: Secondary | ICD-10-CM | POA: Diagnosis not present

## 2021-06-02 DIAGNOSIS — E079 Disorder of thyroid, unspecified: Secondary | ICD-10-CM | POA: Diagnosis not present

## 2021-06-02 DIAGNOSIS — K2100 Gastro-esophageal reflux disease with esophagitis, without bleeding: Secondary | ICD-10-CM | POA: Diagnosis not present

## 2021-06-02 DIAGNOSIS — J449 Chronic obstructive pulmonary disease, unspecified: Secondary | ICD-10-CM | POA: Diagnosis not present

## 2021-06-02 DIAGNOSIS — R634 Abnormal weight loss: Secondary | ICD-10-CM | POA: Diagnosis not present

## 2021-06-02 DIAGNOSIS — E782 Mixed hyperlipidemia: Secondary | ICD-10-CM | POA: Diagnosis not present

## 2021-06-03 DIAGNOSIS — R2689 Other abnormalities of gait and mobility: Secondary | ICD-10-CM | POA: Diagnosis not present

## 2021-06-03 DIAGNOSIS — S22080A Wedge compression fracture of T11-T12 vertebra, initial encounter for closed fracture: Secondary | ICD-10-CM | POA: Diagnosis not present

## 2021-06-03 DIAGNOSIS — M62830 Muscle spasm of back: Secondary | ICD-10-CM | POA: Diagnosis not present

## 2021-06-03 DIAGNOSIS — M2560 Stiffness of unspecified joint, not elsewhere classified: Secondary | ICD-10-CM | POA: Diagnosis not present

## 2021-06-03 DIAGNOSIS — R293 Abnormal posture: Secondary | ICD-10-CM | POA: Diagnosis not present

## 2021-06-03 DIAGNOSIS — M6281 Muscle weakness (generalized): Secondary | ICD-10-CM | POA: Diagnosis not present

## 2021-06-05 DIAGNOSIS — M62830 Muscle spasm of back: Secondary | ICD-10-CM | POA: Diagnosis not present

## 2021-06-05 DIAGNOSIS — M2560 Stiffness of unspecified joint, not elsewhere classified: Secondary | ICD-10-CM | POA: Diagnosis not present

## 2021-06-05 DIAGNOSIS — R293 Abnormal posture: Secondary | ICD-10-CM | POA: Diagnosis not present

## 2021-06-05 DIAGNOSIS — S22080A Wedge compression fracture of T11-T12 vertebra, initial encounter for closed fracture: Secondary | ICD-10-CM | POA: Diagnosis not present

## 2021-06-05 DIAGNOSIS — M6281 Muscle weakness (generalized): Secondary | ICD-10-CM | POA: Diagnosis not present

## 2021-06-05 DIAGNOSIS — R2689 Other abnormalities of gait and mobility: Secondary | ICD-10-CM | POA: Diagnosis not present

## 2021-06-09 DIAGNOSIS — S22000A Wedge compression fracture of unspecified thoracic vertebra, initial encounter for closed fracture: Secondary | ICD-10-CM | POA: Diagnosis not present

## 2021-06-09 DIAGNOSIS — F411 Generalized anxiety disorder: Secondary | ICD-10-CM | POA: Diagnosis not present

## 2021-06-09 DIAGNOSIS — F331 Major depressive disorder, recurrent, moderate: Secondary | ICD-10-CM | POA: Diagnosis not present

## 2021-06-09 DIAGNOSIS — D692 Other nonthrombocytopenic purpura: Secondary | ICD-10-CM | POA: Diagnosis not present

## 2021-06-09 DIAGNOSIS — F17210 Nicotine dependence, cigarettes, uncomplicated: Secondary | ICD-10-CM | POA: Diagnosis not present

## 2021-06-09 DIAGNOSIS — I700 Atherosclerosis of aorta: Secondary | ICD-10-CM | POA: Diagnosis not present

## 2021-06-09 DIAGNOSIS — R4582 Worries: Secondary | ICD-10-CM | POA: Diagnosis not present

## 2021-06-09 DIAGNOSIS — J449 Chronic obstructive pulmonary disease, unspecified: Secondary | ICD-10-CM | POA: Diagnosis not present

## 2021-06-12 DIAGNOSIS — S22080A Wedge compression fracture of T11-T12 vertebra, initial encounter for closed fracture: Secondary | ICD-10-CM | POA: Diagnosis not present

## 2021-06-12 DIAGNOSIS — S22080D Wedge compression fracture of T11-T12 vertebra, subsequent encounter for fracture with routine healing: Secondary | ICD-10-CM | POA: Diagnosis not present

## 2021-06-18 DIAGNOSIS — M810 Age-related osteoporosis without current pathological fracture: Secondary | ICD-10-CM | POA: Diagnosis not present

## 2021-06-19 DIAGNOSIS — R293 Abnormal posture: Secondary | ICD-10-CM | POA: Diagnosis not present

## 2021-06-19 DIAGNOSIS — M2560 Stiffness of unspecified joint, not elsewhere classified: Secondary | ICD-10-CM | POA: Diagnosis not present

## 2021-06-19 DIAGNOSIS — S22080A Wedge compression fracture of T11-T12 vertebra, initial encounter for closed fracture: Secondary | ICD-10-CM | POA: Diagnosis not present

## 2021-06-19 DIAGNOSIS — R2689 Other abnormalities of gait and mobility: Secondary | ICD-10-CM | POA: Diagnosis not present

## 2021-06-19 DIAGNOSIS — M62830 Muscle spasm of back: Secondary | ICD-10-CM | POA: Diagnosis not present

## 2021-06-19 DIAGNOSIS — M6281 Muscle weakness (generalized): Secondary | ICD-10-CM | POA: Diagnosis not present

## 2021-06-23 DIAGNOSIS — R2689 Other abnormalities of gait and mobility: Secondary | ICD-10-CM | POA: Diagnosis not present

## 2021-06-23 DIAGNOSIS — M6281 Muscle weakness (generalized): Secondary | ICD-10-CM | POA: Diagnosis not present

## 2021-06-23 DIAGNOSIS — R293 Abnormal posture: Secondary | ICD-10-CM | POA: Diagnosis not present

## 2021-06-23 DIAGNOSIS — S22080A Wedge compression fracture of T11-T12 vertebra, initial encounter for closed fracture: Secondary | ICD-10-CM | POA: Diagnosis not present

## 2021-06-23 DIAGNOSIS — M2560 Stiffness of unspecified joint, not elsewhere classified: Secondary | ICD-10-CM | POA: Diagnosis not present

## 2021-06-23 DIAGNOSIS — M62830 Muscle spasm of back: Secondary | ICD-10-CM | POA: Diagnosis not present

## 2021-06-25 DIAGNOSIS — M62830 Muscle spasm of back: Secondary | ICD-10-CM | POA: Diagnosis not present

## 2021-06-25 DIAGNOSIS — M6281 Muscle weakness (generalized): Secondary | ICD-10-CM | POA: Diagnosis not present

## 2021-06-25 DIAGNOSIS — M2560 Stiffness of unspecified joint, not elsewhere classified: Secondary | ICD-10-CM | POA: Diagnosis not present

## 2021-06-25 DIAGNOSIS — R2689 Other abnormalities of gait and mobility: Secondary | ICD-10-CM | POA: Diagnosis not present

## 2021-06-25 DIAGNOSIS — R293 Abnormal posture: Secondary | ICD-10-CM | POA: Diagnosis not present

## 2021-06-25 DIAGNOSIS — S22080A Wedge compression fracture of T11-T12 vertebra, initial encounter for closed fracture: Secondary | ICD-10-CM | POA: Diagnosis not present

## 2021-07-03 DIAGNOSIS — R2689 Other abnormalities of gait and mobility: Secondary | ICD-10-CM | POA: Diagnosis not present

## 2021-07-03 DIAGNOSIS — R293 Abnormal posture: Secondary | ICD-10-CM | POA: Diagnosis not present

## 2021-07-03 DIAGNOSIS — M62830 Muscle spasm of back: Secondary | ICD-10-CM | POA: Diagnosis not present

## 2021-07-03 DIAGNOSIS — M2560 Stiffness of unspecified joint, not elsewhere classified: Secondary | ICD-10-CM | POA: Diagnosis not present

## 2021-07-03 DIAGNOSIS — S22080A Wedge compression fracture of T11-T12 vertebra, initial encounter for closed fracture: Secondary | ICD-10-CM | POA: Diagnosis not present

## 2021-07-03 DIAGNOSIS — M6281 Muscle weakness (generalized): Secondary | ICD-10-CM | POA: Diagnosis not present

## 2021-07-08 DIAGNOSIS — M2560 Stiffness of unspecified joint, not elsewhere classified: Secondary | ICD-10-CM | POA: Diagnosis not present

## 2021-07-08 DIAGNOSIS — R293 Abnormal posture: Secondary | ICD-10-CM | POA: Diagnosis not present

## 2021-07-08 DIAGNOSIS — M62830 Muscle spasm of back: Secondary | ICD-10-CM | POA: Diagnosis not present

## 2021-07-08 DIAGNOSIS — R2689 Other abnormalities of gait and mobility: Secondary | ICD-10-CM | POA: Diagnosis not present

## 2021-07-08 DIAGNOSIS — S22080A Wedge compression fracture of T11-T12 vertebra, initial encounter for closed fracture: Secondary | ICD-10-CM | POA: Diagnosis not present

## 2021-07-08 DIAGNOSIS — M6281 Muscle weakness (generalized): Secondary | ICD-10-CM | POA: Diagnosis not present

## 2021-07-14 DIAGNOSIS — S22080A Wedge compression fracture of T11-T12 vertebra, initial encounter for closed fracture: Secondary | ICD-10-CM | POA: Diagnosis not present

## 2021-07-14 DIAGNOSIS — R2689 Other abnormalities of gait and mobility: Secondary | ICD-10-CM | POA: Diagnosis not present

## 2021-07-14 DIAGNOSIS — M62830 Muscle spasm of back: Secondary | ICD-10-CM | POA: Diagnosis not present

## 2021-07-14 DIAGNOSIS — M2560 Stiffness of unspecified joint, not elsewhere classified: Secondary | ICD-10-CM | POA: Diagnosis not present

## 2021-07-14 DIAGNOSIS — M6281 Muscle weakness (generalized): Secondary | ICD-10-CM | POA: Diagnosis not present

## 2021-07-14 DIAGNOSIS — R293 Abnormal posture: Secondary | ICD-10-CM | POA: Diagnosis not present

## 2021-07-16 DIAGNOSIS — M2560 Stiffness of unspecified joint, not elsewhere classified: Secondary | ICD-10-CM | POA: Diagnosis not present

## 2021-07-16 DIAGNOSIS — S22080A Wedge compression fracture of T11-T12 vertebra, initial encounter for closed fracture: Secondary | ICD-10-CM | POA: Diagnosis not present

## 2021-07-16 DIAGNOSIS — R2689 Other abnormalities of gait and mobility: Secondary | ICD-10-CM | POA: Diagnosis not present

## 2021-07-16 DIAGNOSIS — M6281 Muscle weakness (generalized): Secondary | ICD-10-CM | POA: Diagnosis not present

## 2021-07-16 DIAGNOSIS — M62830 Muscle spasm of back: Secondary | ICD-10-CM | POA: Diagnosis not present

## 2021-07-16 DIAGNOSIS — R293 Abnormal posture: Secondary | ICD-10-CM | POA: Diagnosis not present

## 2021-07-21 DIAGNOSIS — R2689 Other abnormalities of gait and mobility: Secondary | ICD-10-CM | POA: Diagnosis not present

## 2021-07-21 DIAGNOSIS — M2560 Stiffness of unspecified joint, not elsewhere classified: Secondary | ICD-10-CM | POA: Diagnosis not present

## 2021-07-21 DIAGNOSIS — R293 Abnormal posture: Secondary | ICD-10-CM | POA: Diagnosis not present

## 2021-07-21 DIAGNOSIS — S22080A Wedge compression fracture of T11-T12 vertebra, initial encounter for closed fracture: Secondary | ICD-10-CM | POA: Diagnosis not present

## 2021-07-21 DIAGNOSIS — M6281 Muscle weakness (generalized): Secondary | ICD-10-CM | POA: Diagnosis not present

## 2021-07-21 DIAGNOSIS — M62830 Muscle spasm of back: Secondary | ICD-10-CM | POA: Diagnosis not present

## 2021-07-23 DIAGNOSIS — S22080A Wedge compression fracture of T11-T12 vertebra, initial encounter for closed fracture: Secondary | ICD-10-CM | POA: Diagnosis not present

## 2021-07-23 DIAGNOSIS — R293 Abnormal posture: Secondary | ICD-10-CM | POA: Diagnosis not present

## 2021-07-23 DIAGNOSIS — M2560 Stiffness of unspecified joint, not elsewhere classified: Secondary | ICD-10-CM | POA: Diagnosis not present

## 2021-07-23 DIAGNOSIS — R2689 Other abnormalities of gait and mobility: Secondary | ICD-10-CM | POA: Diagnosis not present

## 2021-07-23 DIAGNOSIS — M6281 Muscle weakness (generalized): Secondary | ICD-10-CM | POA: Diagnosis not present

## 2021-07-23 DIAGNOSIS — M62830 Muscle spasm of back: Secondary | ICD-10-CM | POA: Diagnosis not present

## 2021-07-29 DIAGNOSIS — R293 Abnormal posture: Secondary | ICD-10-CM | POA: Diagnosis not present

## 2021-07-29 DIAGNOSIS — M62830 Muscle spasm of back: Secondary | ICD-10-CM | POA: Diagnosis not present

## 2021-07-29 DIAGNOSIS — M6281 Muscle weakness (generalized): Secondary | ICD-10-CM | POA: Diagnosis not present

## 2021-07-29 DIAGNOSIS — M2560 Stiffness of unspecified joint, not elsewhere classified: Secondary | ICD-10-CM | POA: Diagnosis not present

## 2021-07-29 DIAGNOSIS — R2689 Other abnormalities of gait and mobility: Secondary | ICD-10-CM | POA: Diagnosis not present

## 2021-07-29 DIAGNOSIS — S22080A Wedge compression fracture of T11-T12 vertebra, initial encounter for closed fracture: Secondary | ICD-10-CM | POA: Diagnosis not present

## 2021-07-31 DIAGNOSIS — Z681 Body mass index (BMI) 19 or less, adult: Secondary | ICD-10-CM | POA: Diagnosis not present

## 2021-07-31 DIAGNOSIS — S22080A Wedge compression fracture of T11-T12 vertebra, initial encounter for closed fracture: Secondary | ICD-10-CM | POA: Diagnosis not present

## 2021-07-31 DIAGNOSIS — S22080D Wedge compression fracture of T11-T12 vertebra, subsequent encounter for fracture with routine healing: Secondary | ICD-10-CM | POA: Diagnosis not present

## 2021-11-06 DIAGNOSIS — Z681 Body mass index (BMI) 19 or less, adult: Secondary | ICD-10-CM | POA: Diagnosis not present

## 2021-11-06 DIAGNOSIS — S22080D Wedge compression fracture of T11-T12 vertebra, subsequent encounter for fracture with routine healing: Secondary | ICD-10-CM | POA: Diagnosis not present

## 2021-11-16 DIAGNOSIS — E559 Vitamin D deficiency, unspecified: Secondary | ICD-10-CM | POA: Diagnosis not present

## 2021-11-16 DIAGNOSIS — E875 Hyperkalemia: Secondary | ICD-10-CM | POA: Diagnosis not present

## 2021-11-16 DIAGNOSIS — E7849 Other hyperlipidemia: Secondary | ICD-10-CM | POA: Diagnosis not present

## 2021-11-20 DIAGNOSIS — I700 Atherosclerosis of aorta: Secondary | ICD-10-CM | POA: Diagnosis not present

## 2021-11-20 DIAGNOSIS — F331 Major depressive disorder, recurrent, moderate: Secondary | ICD-10-CM | POA: Diagnosis not present

## 2021-11-20 DIAGNOSIS — E7849 Other hyperlipidemia: Secondary | ICD-10-CM | POA: Diagnosis not present

## 2021-11-20 DIAGNOSIS — K2100 Gastro-esophageal reflux disease with esophagitis, without bleeding: Secondary | ICD-10-CM | POA: Diagnosis not present

## 2021-11-20 DIAGNOSIS — F17210 Nicotine dependence, cigarettes, uncomplicated: Secondary | ICD-10-CM | POA: Diagnosis not present

## 2021-11-20 DIAGNOSIS — F411 Generalized anxiety disorder: Secondary | ICD-10-CM | POA: Diagnosis not present

## 2021-11-20 DIAGNOSIS — R4582 Worries: Secondary | ICD-10-CM | POA: Diagnosis not present

## 2021-11-20 DIAGNOSIS — S22000A Wedge compression fracture of unspecified thoracic vertebra, initial encounter for closed fracture: Secondary | ICD-10-CM | POA: Diagnosis not present

## 2021-11-20 DIAGNOSIS — D692 Other nonthrombocytopenic purpura: Secondary | ICD-10-CM | POA: Diagnosis not present

## 2021-11-20 DIAGNOSIS — M810 Age-related osteoporosis without current pathological fracture: Secondary | ICD-10-CM | POA: Diagnosis not present

## 2021-11-20 DIAGNOSIS — J449 Chronic obstructive pulmonary disease, unspecified: Secondary | ICD-10-CM | POA: Diagnosis not present

## 2021-11-20 DIAGNOSIS — Z23 Encounter for immunization: Secondary | ICD-10-CM | POA: Diagnosis not present

## 2021-11-25 DIAGNOSIS — Z23 Encounter for immunization: Secondary | ICD-10-CM | POA: Diagnosis not present

## 2022-01-25 DIAGNOSIS — E782 Mixed hyperlipidemia: Secondary | ICD-10-CM | POA: Diagnosis not present

## 2022-01-25 DIAGNOSIS — K219 Gastro-esophageal reflux disease without esophagitis: Secondary | ICD-10-CM | POA: Diagnosis not present

## 2022-01-25 DIAGNOSIS — R32 Unspecified urinary incontinence: Secondary | ICD-10-CM | POA: Diagnosis not present

## 2022-01-25 DIAGNOSIS — Z8501 Personal history of malignant neoplasm of esophagus: Secondary | ICD-10-CM | POA: Diagnosis not present

## 2022-01-25 DIAGNOSIS — F419 Anxiety disorder, unspecified: Secondary | ICD-10-CM | POA: Diagnosis not present

## 2022-01-25 DIAGNOSIS — F329 Major depressive disorder, single episode, unspecified: Secondary | ICD-10-CM | POA: Diagnosis not present

## 2022-01-25 DIAGNOSIS — Z7689 Persons encountering health services in other specified circumstances: Secondary | ICD-10-CM | POA: Diagnosis not present

## 2022-03-02 DIAGNOSIS — F419 Anxiety disorder, unspecified: Secondary | ICD-10-CM | POA: Diagnosis not present

## 2022-03-02 DIAGNOSIS — F17200 Nicotine dependence, unspecified, uncomplicated: Secondary | ICD-10-CM | POA: Diagnosis not present

## 2022-03-02 DIAGNOSIS — R1310 Dysphagia, unspecified: Secondary | ICD-10-CM | POA: Diagnosis not present

## 2022-03-02 DIAGNOSIS — F329 Major depressive disorder, single episode, unspecified: Secondary | ICD-10-CM | POA: Diagnosis not present

## 2022-03-02 DIAGNOSIS — E639 Nutritional deficiency, unspecified: Secondary | ICD-10-CM | POA: Diagnosis not present

## 2022-03-02 DIAGNOSIS — J449 Chronic obstructive pulmonary disease, unspecified: Secondary | ICD-10-CM | POA: Diagnosis not present

## 2022-03-02 DIAGNOSIS — D692 Other nonthrombocytopenic purpura: Secondary | ICD-10-CM | POA: Diagnosis not present

## 2022-03-02 DIAGNOSIS — E782 Mixed hyperlipidemia: Secondary | ICD-10-CM | POA: Diagnosis not present

## 2022-03-02 DIAGNOSIS — Z8501 Personal history of malignant neoplasm of esophagus: Secondary | ICD-10-CM | POA: Diagnosis not present

## 2022-03-05 DIAGNOSIS — R262 Difficulty in walking, not elsewhere classified: Secondary | ICD-10-CM | POA: Diagnosis not present

## 2022-03-05 DIAGNOSIS — I739 Peripheral vascular disease, unspecified: Secondary | ICD-10-CM | POA: Diagnosis not present

## 2022-03-05 DIAGNOSIS — L97909 Non-pressure chronic ulcer of unspecified part of unspecified lower leg with unspecified severity: Secondary | ICD-10-CM | POA: Diagnosis not present

## 2022-03-05 DIAGNOSIS — F321 Major depressive disorder, single episode, moderate: Secondary | ICD-10-CM | POA: Diagnosis not present

## 2022-03-05 DIAGNOSIS — F17200 Nicotine dependence, unspecified, uncomplicated: Secondary | ICD-10-CM | POA: Diagnosis not present

## 2022-03-05 DIAGNOSIS — Z9181 History of falling: Secondary | ICD-10-CM | POA: Diagnosis not present

## 2022-03-10 DIAGNOSIS — R1310 Dysphagia, unspecified: Secondary | ICD-10-CM | POA: Diagnosis not present

## 2022-03-10 DIAGNOSIS — L97929 Non-pressure chronic ulcer of unspecified part of left lower leg with unspecified severity: Secondary | ICD-10-CM | POA: Diagnosis not present

## 2022-03-12 DIAGNOSIS — R1310 Dysphagia, unspecified: Secondary | ICD-10-CM | POA: Diagnosis not present

## 2022-03-12 DIAGNOSIS — M1990 Unspecified osteoarthritis, unspecified site: Secondary | ICD-10-CM | POA: Diagnosis not present

## 2022-03-12 DIAGNOSIS — F419 Anxiety disorder, unspecified: Secondary | ICD-10-CM | POA: Diagnosis not present

## 2022-03-12 DIAGNOSIS — K449 Diaphragmatic hernia without obstruction or gangrene: Secondary | ICD-10-CM | POA: Diagnosis not present

## 2022-03-12 DIAGNOSIS — K222 Esophageal obstruction: Secondary | ICD-10-CM | POA: Diagnosis not present

## 2022-03-12 DIAGNOSIS — Z79899 Other long term (current) drug therapy: Secondary | ICD-10-CM | POA: Diagnosis not present

## 2022-03-12 DIAGNOSIS — Z9221 Personal history of antineoplastic chemotherapy: Secondary | ICD-10-CM | POA: Diagnosis not present

## 2022-03-12 DIAGNOSIS — Z9851 Tubal ligation status: Secondary | ICD-10-CM | POA: Diagnosis not present

## 2022-03-12 DIAGNOSIS — J449 Chronic obstructive pulmonary disease, unspecified: Secondary | ICD-10-CM | POA: Diagnosis not present

## 2022-03-12 DIAGNOSIS — K2970 Gastritis, unspecified, without bleeding: Secondary | ICD-10-CM | POA: Diagnosis not present

## 2022-03-12 DIAGNOSIS — K2930 Chronic superficial gastritis without bleeding: Secondary | ICD-10-CM | POA: Diagnosis not present

## 2022-03-12 DIAGNOSIS — F17210 Nicotine dependence, cigarettes, uncomplicated: Secondary | ICD-10-CM | POA: Diagnosis not present

## 2022-03-12 DIAGNOSIS — F32A Depression, unspecified: Secondary | ICD-10-CM | POA: Diagnosis not present

## 2022-03-12 DIAGNOSIS — Z8501 Personal history of malignant neoplasm of esophagus: Secondary | ICD-10-CM | POA: Diagnosis not present

## 2022-03-12 DIAGNOSIS — Z923 Personal history of irradiation: Secondary | ICD-10-CM | POA: Diagnosis not present

## 2022-03-12 DIAGNOSIS — Z7982 Long term (current) use of aspirin: Secondary | ICD-10-CM | POA: Diagnosis not present

## 2022-03-19 DIAGNOSIS — L97929 Non-pressure chronic ulcer of unspecified part of left lower leg with unspecified severity: Secondary | ICD-10-CM | POA: Diagnosis not present

## 2022-03-31 DIAGNOSIS — K2930 Chronic superficial gastritis without bleeding: Secondary | ICD-10-CM | POA: Diagnosis not present

## 2022-03-31 DIAGNOSIS — K222 Esophageal obstruction: Secondary | ICD-10-CM | POA: Diagnosis not present

## 2022-03-31 DIAGNOSIS — K449 Diaphragmatic hernia without obstruction or gangrene: Secondary | ICD-10-CM | POA: Diagnosis not present

## 2022-03-31 DIAGNOSIS — R1310 Dysphagia, unspecified: Secondary | ICD-10-CM | POA: Diagnosis not present

## 2022-04-05 DIAGNOSIS — K2100 Gastro-esophageal reflux disease with esophagitis, without bleeding: Secondary | ICD-10-CM | POA: Diagnosis not present

## 2022-04-05 DIAGNOSIS — E7849 Other hyperlipidemia: Secondary | ICD-10-CM | POA: Diagnosis not present

## 2022-04-05 DIAGNOSIS — Z0001 Encounter for general adult medical examination with abnormal findings: Secondary | ICD-10-CM | POA: Diagnosis not present

## 2022-04-05 DIAGNOSIS — F17210 Nicotine dependence, cigarettes, uncomplicated: Secondary | ICD-10-CM | POA: Diagnosis not present

## 2022-04-05 DIAGNOSIS — Q446 Cystic disease of liver: Secondary | ICD-10-CM | POA: Diagnosis not present

## 2022-04-05 DIAGNOSIS — R911 Solitary pulmonary nodule: Secondary | ICD-10-CM | POA: Diagnosis not present

## 2022-04-05 DIAGNOSIS — E079 Disorder of thyroid, unspecified: Secondary | ICD-10-CM | POA: Diagnosis not present

## 2022-04-05 DIAGNOSIS — J449 Chronic obstructive pulmonary disease, unspecified: Secondary | ICD-10-CM | POA: Diagnosis not present

## 2022-04-08 IMAGING — RF DG THORACOLUMBAR SPINE 2V
2 series · 2 of 2 positions shown · non-contrast
Comparison: April 13, 2021.

CLINICAL DATA: T12 kyphoplasty.

EXAM:
THORACOLUMBAR SPINE 1V
Radiation exposure index: 9.72 mGy.

[Series 1: run · 1 of 1 slices shown (1 of 2)]
[im 1/1]
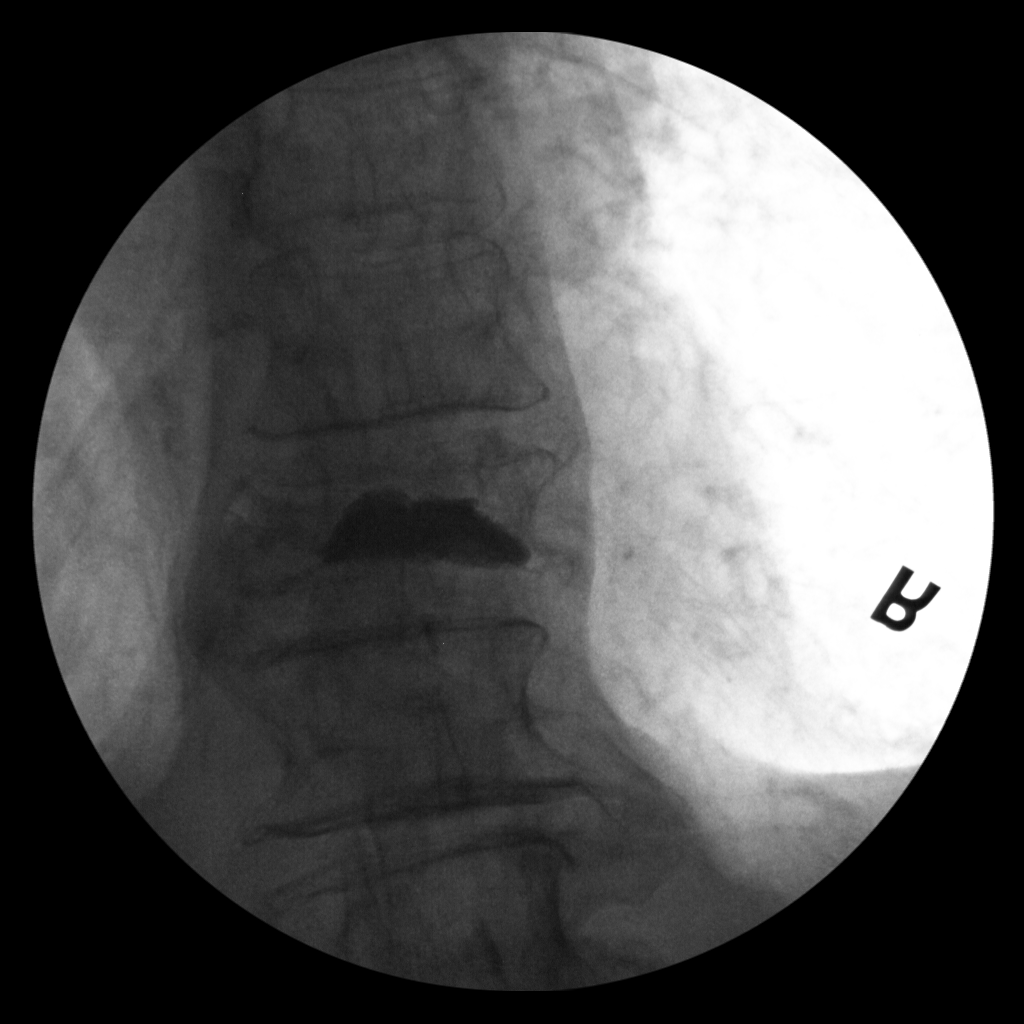

[Series 1: run · 1 of 1 slices shown (2 of 2)]
[im 1/1]
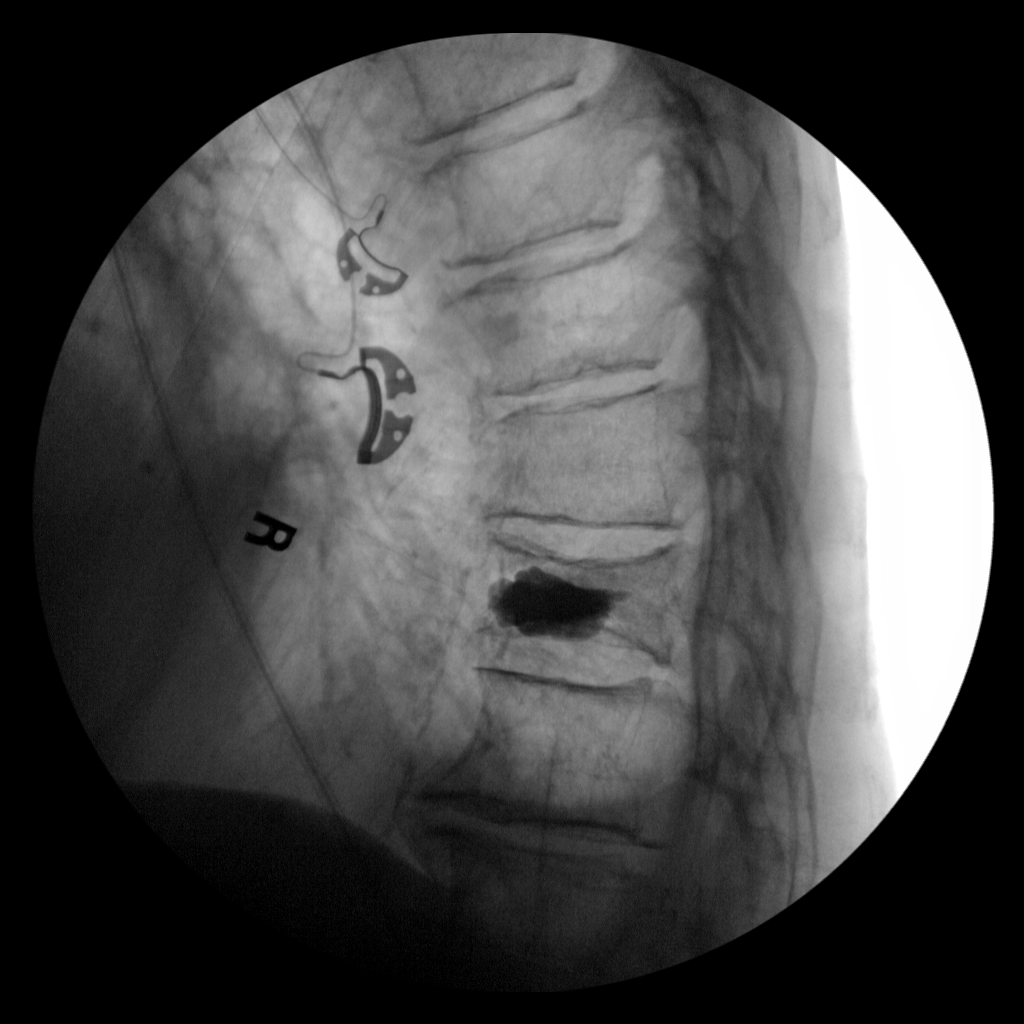

[2 of 2 positions shown; findings below may reference images not displayed]

FINDINGS: Two intraoperative fluoroscopic images of the thoracolumbar spine
were obtained. These images demonstrate the patient be status post
kyphoplasty T12.
IMPRESSION: Fluoroscopic guidance provided during T12 kyphoplasty.

## 2022-06-02 DIAGNOSIS — K222 Esophageal obstruction: Secondary | ICD-10-CM | POA: Diagnosis not present

## 2022-06-02 DIAGNOSIS — K449 Diaphragmatic hernia without obstruction or gangrene: Secondary | ICD-10-CM | POA: Diagnosis not present

## 2022-06-02 DIAGNOSIS — K2930 Chronic superficial gastritis without bleeding: Secondary | ICD-10-CM | POA: Diagnosis not present

## 2022-06-04 DIAGNOSIS — F419 Anxiety disorder, unspecified: Secondary | ICD-10-CM | POA: Diagnosis not present

## 2022-06-04 DIAGNOSIS — D692 Other nonthrombocytopenic purpura: Secondary | ICD-10-CM | POA: Diagnosis not present

## 2022-06-04 DIAGNOSIS — Z8501 Personal history of malignant neoplasm of esophagus: Secondary | ICD-10-CM | POA: Diagnosis not present

## 2022-06-04 DIAGNOSIS — F329 Major depressive disorder, single episode, unspecified: Secondary | ICD-10-CM | POA: Diagnosis not present

## 2022-06-04 DIAGNOSIS — J449 Chronic obstructive pulmonary disease, unspecified: Secondary | ICD-10-CM | POA: Diagnosis not present

## 2022-06-25 DIAGNOSIS — K2100 Gastro-esophageal reflux disease with esophagitis, without bleeding: Secondary | ICD-10-CM | POA: Diagnosis not present

## 2022-06-25 DIAGNOSIS — K253 Acute gastric ulcer without hemorrhage or perforation: Secondary | ICD-10-CM | POA: Diagnosis not present

## 2022-06-25 DIAGNOSIS — K2950 Unspecified chronic gastritis without bleeding: Secondary | ICD-10-CM | POA: Diagnosis not present

## 2022-06-25 DIAGNOSIS — K222 Esophageal obstruction: Secondary | ICD-10-CM | POA: Diagnosis not present

## 2022-06-25 DIAGNOSIS — R1310 Dysphagia, unspecified: Secondary | ICD-10-CM | POA: Diagnosis not present

## 2022-07-16 DIAGNOSIS — K2100 Gastro-esophageal reflux disease with esophagitis, without bleeding: Secondary | ICD-10-CM | POA: Diagnosis not present

## 2022-07-16 DIAGNOSIS — K222 Esophageal obstruction: Secondary | ICD-10-CM | POA: Diagnosis not present

## 2022-07-16 DIAGNOSIS — K253 Acute gastric ulcer without hemorrhage or perforation: Secondary | ICD-10-CM | POA: Diagnosis not present

## 2022-12-07 DIAGNOSIS — R5383 Other fatigue: Secondary | ICD-10-CM | POA: Diagnosis not present

## 2022-12-07 DIAGNOSIS — Z8501 Personal history of malignant neoplasm of esophagus: Secondary | ICD-10-CM | POA: Diagnosis not present

## 2022-12-07 DIAGNOSIS — Z23 Encounter for immunization: Secondary | ICD-10-CM | POA: Diagnosis not present

## 2022-12-07 DIAGNOSIS — F32A Depression, unspecified: Secondary | ICD-10-CM | POA: Diagnosis not present

## 2022-12-07 DIAGNOSIS — F419 Anxiety disorder, unspecified: Secondary | ICD-10-CM | POA: Diagnosis not present

## 2022-12-07 DIAGNOSIS — E559 Vitamin D deficiency, unspecified: Secondary | ICD-10-CM | POA: Diagnosis not present

## 2023-01-03 DIAGNOSIS — Z23 Encounter for immunization: Secondary | ICD-10-CM | POA: Diagnosis not present

## 2023-01-03 DIAGNOSIS — F331 Major depressive disorder, recurrent, moderate: Secondary | ICD-10-CM | POA: Diagnosis not present

## 2023-01-03 DIAGNOSIS — E7849 Other hyperlipidemia: Secondary | ICD-10-CM | POA: Diagnosis not present

## 2023-01-03 DIAGNOSIS — K2100 Gastro-esophageal reflux disease with esophagitis, without bleeding: Secondary | ICD-10-CM | POA: Diagnosis not present

## 2023-01-03 DIAGNOSIS — F411 Generalized anxiety disorder: Secondary | ICD-10-CM | POA: Diagnosis not present

## 2023-01-03 DIAGNOSIS — F17210 Nicotine dependence, cigarettes, uncomplicated: Secondary | ICD-10-CM | POA: Diagnosis not present

## 2023-01-03 DIAGNOSIS — M810 Age-related osteoporosis without current pathological fracture: Secondary | ICD-10-CM | POA: Diagnosis not present

## 2023-01-03 DIAGNOSIS — Z681 Body mass index (BMI) 19 or less, adult: Secondary | ICD-10-CM | POA: Diagnosis not present

## 2023-01-03 DIAGNOSIS — I700 Atherosclerosis of aorta: Secondary | ICD-10-CM | POA: Diagnosis not present

## 2023-01-03 DIAGNOSIS — J449 Chronic obstructive pulmonary disease, unspecified: Secondary | ICD-10-CM | POA: Diagnosis not present

## 2023-01-03 DIAGNOSIS — R030 Elevated blood-pressure reading, without diagnosis of hypertension: Secondary | ICD-10-CM | POA: Diagnosis not present

## 2023-01-11 DIAGNOSIS — R300 Dysuria: Secondary | ICD-10-CM | POA: Diagnosis not present

## 2023-01-11 DIAGNOSIS — Z681 Body mass index (BMI) 19 or less, adult: Secondary | ICD-10-CM | POA: Diagnosis not present

## 2023-01-11 DIAGNOSIS — F17210 Nicotine dependence, cigarettes, uncomplicated: Secondary | ICD-10-CM | POA: Diagnosis not present

## 2023-01-11 DIAGNOSIS — R030 Elevated blood-pressure reading, without diagnosis of hypertension: Secondary | ICD-10-CM | POA: Diagnosis not present

## 2023-01-11 DIAGNOSIS — R509 Fever, unspecified: Secondary | ICD-10-CM | POA: Diagnosis not present

## 2023-01-11 DIAGNOSIS — Z20828 Contact with and (suspected) exposure to other viral communicable diseases: Secondary | ICD-10-CM | POA: Diagnosis not present

## 2023-01-11 DIAGNOSIS — J189 Pneumonia, unspecified organism: Secondary | ICD-10-CM | POA: Diagnosis not present

## 2023-03-21 DIAGNOSIS — L97929 Non-pressure chronic ulcer of unspecified part of left lower leg with unspecified severity: Secondary | ICD-10-CM | POA: Diagnosis not present

## 2023-03-21 DIAGNOSIS — Z681 Body mass index (BMI) 19 or less, adult: Secondary | ICD-10-CM | POA: Diagnosis not present

## 2023-04-27 DIAGNOSIS — Q446 Cystic disease of liver: Secondary | ICD-10-CM | POA: Diagnosis not present

## 2023-04-27 DIAGNOSIS — E079 Disorder of thyroid, unspecified: Secondary | ICD-10-CM | POA: Diagnosis not present

## 2023-04-27 DIAGNOSIS — E559 Vitamin D deficiency, unspecified: Secondary | ICD-10-CM | POA: Diagnosis not present

## 2023-04-27 DIAGNOSIS — E7849 Other hyperlipidemia: Secondary | ICD-10-CM | POA: Diagnosis not present

## 2023-04-27 DIAGNOSIS — R634 Abnormal weight loss: Secondary | ICD-10-CM | POA: Diagnosis not present

## 2023-04-27 DIAGNOSIS — E875 Hyperkalemia: Secondary | ICD-10-CM | POA: Diagnosis not present

## 2023-04-27 DIAGNOSIS — K2100 Gastro-esophageal reflux disease with esophagitis, without bleeding: Secondary | ICD-10-CM | POA: Diagnosis not present

## 2023-05-06 DIAGNOSIS — Z9189 Other specified personal risk factors, not elsewhere classified: Secondary | ICD-10-CM | POA: Diagnosis not present

## 2023-05-06 DIAGNOSIS — E559 Vitamin D deficiency, unspecified: Secondary | ICD-10-CM | POA: Diagnosis not present

## 2023-05-06 DIAGNOSIS — F17210 Nicotine dependence, cigarettes, uncomplicated: Secondary | ICD-10-CM | POA: Diagnosis not present

## 2023-05-06 DIAGNOSIS — F331 Major depressive disorder, recurrent, moderate: Secondary | ICD-10-CM | POA: Diagnosis not present

## 2023-05-06 DIAGNOSIS — K2100 Gastro-esophageal reflux disease with esophagitis, without bleeding: Secondary | ICD-10-CM | POA: Diagnosis not present

## 2023-05-06 DIAGNOSIS — Z1389 Encounter for screening for other disorder: Secondary | ICD-10-CM | POA: Diagnosis not present

## 2023-05-06 DIAGNOSIS — E782 Mixed hyperlipidemia: Secondary | ICD-10-CM | POA: Diagnosis not present

## 2023-05-06 DIAGNOSIS — Z0001 Encounter for general adult medical examination with abnormal findings: Secondary | ICD-10-CM | POA: Diagnosis not present

## 2023-05-06 DIAGNOSIS — Z1331 Encounter for screening for depression: Secondary | ICD-10-CM | POA: Diagnosis not present

## 2023-05-06 DIAGNOSIS — Z681 Body mass index (BMI) 19 or less, adult: Secondary | ICD-10-CM | POA: Diagnosis not present

## 2023-05-06 DIAGNOSIS — Z23 Encounter for immunization: Secondary | ICD-10-CM | POA: Diagnosis not present

## 2023-05-26 DIAGNOSIS — L89102 Pressure ulcer of unspecified part of back, stage 2: Secondary | ICD-10-CM | POA: Diagnosis not present

## 2023-05-26 DIAGNOSIS — L03312 Cellulitis of back [any part except buttock]: Secondary | ICD-10-CM | POA: Diagnosis not present

## 2023-05-26 DIAGNOSIS — Z681 Body mass index (BMI) 19 or less, adult: Secondary | ICD-10-CM | POA: Diagnosis not present

## 2023-05-26 DIAGNOSIS — F331 Major depressive disorder, recurrent, moderate: Secondary | ICD-10-CM | POA: Diagnosis not present

## 2023-06-06 ENCOUNTER — Encounter (HOSPITAL_COMMUNITY): Payer: Self-pay

## 2023-06-06 DIAGNOSIS — Z8501 Personal history of malignant neoplasm of esophagus: Secondary | ICD-10-CM | POA: Diagnosis not present

## 2023-06-06 DIAGNOSIS — L03312 Cellulitis of back [any part except buttock]: Secondary | ICD-10-CM | POA: Diagnosis not present

## 2023-06-06 DIAGNOSIS — M810 Age-related osteoporosis without current pathological fracture: Secondary | ICD-10-CM | POA: Diagnosis not present

## 2023-06-06 DIAGNOSIS — E118 Type 2 diabetes mellitus with unspecified complications: Secondary | ICD-10-CM | POA: Diagnosis not present

## 2023-06-06 DIAGNOSIS — E119 Type 2 diabetes mellitus without complications: Secondary | ICD-10-CM | POA: Diagnosis not present

## 2023-06-06 DIAGNOSIS — J449 Chronic obstructive pulmonary disease, unspecified: Secondary | ICD-10-CM | POA: Diagnosis not present

## 2023-06-06 DIAGNOSIS — S22080D Wedge compression fracture of T11-T12 vertebra, subsequent encounter for fracture with routine healing: Secondary | ICD-10-CM | POA: Diagnosis not present

## 2023-06-06 DIAGNOSIS — Z9181 History of falling: Secondary | ICD-10-CM | POA: Diagnosis not present

## 2023-06-06 DIAGNOSIS — I700 Atherosclerosis of aorta: Secondary | ICD-10-CM | POA: Diagnosis not present

## 2023-06-06 DIAGNOSIS — M4805 Spinal stenosis, thoracolumbar region: Secondary | ICD-10-CM | POA: Diagnosis not present

## 2023-06-06 DIAGNOSIS — Z604 Social exclusion and rejection: Secondary | ICD-10-CM | POA: Diagnosis not present

## 2023-06-06 DIAGNOSIS — F17210 Nicotine dependence, cigarettes, uncomplicated: Secondary | ICD-10-CM | POA: Diagnosis not present

## 2023-06-06 DIAGNOSIS — S61412D Laceration without foreign body of left hand, subsequent encounter: Secondary | ICD-10-CM | POA: Diagnosis not present

## 2023-06-06 DIAGNOSIS — E782 Mixed hyperlipidemia: Secondary | ICD-10-CM | POA: Diagnosis not present

## 2023-06-06 DIAGNOSIS — M4644 Discitis, unspecified, thoracic region: Secondary | ICD-10-CM | POA: Diagnosis not present

## 2023-06-06 DIAGNOSIS — D692 Other nonthrombocytopenic purpura: Secondary | ICD-10-CM | POA: Diagnosis not present

## 2023-06-06 DIAGNOSIS — S51812D Laceration without foreign body of left forearm, subsequent encounter: Secondary | ICD-10-CM | POA: Diagnosis not present

## 2023-06-06 DIAGNOSIS — F411 Generalized anxiety disorder: Secondary | ICD-10-CM | POA: Diagnosis not present

## 2023-06-06 DIAGNOSIS — S61512D Laceration without foreign body of left wrist, subsequent encounter: Secondary | ICD-10-CM | POA: Diagnosis not present

## 2023-06-06 DIAGNOSIS — S51012D Laceration without foreign body of left elbow, subsequent encounter: Secondary | ICD-10-CM | POA: Diagnosis not present

## 2023-06-06 DIAGNOSIS — F331 Major depressive disorder, recurrent, moderate: Secondary | ICD-10-CM | POA: Diagnosis not present

## 2023-06-06 DIAGNOSIS — R634 Abnormal weight loss: Secondary | ICD-10-CM | POA: Diagnosis not present

## 2023-06-06 DIAGNOSIS — Z6822 Body mass index (BMI) 22.0-22.9, adult: Secondary | ICD-10-CM | POA: Diagnosis not present

## 2023-06-06 DIAGNOSIS — E440 Moderate protein-calorie malnutrition: Secondary | ICD-10-CM | POA: Diagnosis not present

## 2023-06-06 DIAGNOSIS — L89114 Pressure ulcer of right upper back, stage 4: Secondary | ICD-10-CM | POA: Diagnosis not present

## 2023-06-06 DIAGNOSIS — I10 Essential (primary) hypertension: Secondary | ICD-10-CM | POA: Diagnosis not present

## 2023-06-07 ENCOUNTER — Encounter (HOSPITAL_COMMUNITY): Payer: Self-pay | Admitting: Family Medicine

## 2023-06-07 ENCOUNTER — Inpatient Hospital Stay (HOSPITAL_COMMUNITY)
Admit: 2023-06-07 | Discharge: 2023-06-09 | DRG: 551 | Disposition: A | Discharge status: Home Health | Source: Other Acute Inpatient Hospital | Attending: Internal Medicine | Admitting: Internal Medicine

## 2023-06-07 DIAGNOSIS — Z8501 Personal history of malignant neoplasm of esophagus: Secondary | ICD-10-CM | POA: Diagnosis not present

## 2023-06-07 DIAGNOSIS — M4624 Osteomyelitis of vertebra, thoracic region: Secondary | ICD-10-CM

## 2023-06-07 DIAGNOSIS — Z7951 Long term (current) use of inhaled steroids: Secondary | ICD-10-CM

## 2023-06-07 DIAGNOSIS — Y92239 Unspecified place in hospital as the place of occurrence of the external cause: Secondary | ICD-10-CM | POA: Diagnosis not present

## 2023-06-07 DIAGNOSIS — S51011A Laceration without foreign body of right elbow, initial encounter: Secondary | ICD-10-CM

## 2023-06-07 DIAGNOSIS — R770 Abnormality of albumin: Secondary | ICD-10-CM | POA: Diagnosis present

## 2023-06-07 DIAGNOSIS — Z79899 Other long term (current) drug therapy: Secondary | ICD-10-CM | POA: Diagnosis not present

## 2023-06-07 DIAGNOSIS — Z681 Body mass index (BMI) 19 or less, adult: Secondary | ICD-10-CM

## 2023-06-07 DIAGNOSIS — W19XXXA Unspecified fall, initial encounter: Secondary | ICD-10-CM | POA: Diagnosis not present

## 2023-06-07 DIAGNOSIS — E43 Unspecified severe protein-calorie malnutrition: Secondary | ICD-10-CM

## 2023-06-07 DIAGNOSIS — R9082 White matter disease, unspecified: Secondary | ICD-10-CM | POA: Diagnosis not present

## 2023-06-07 DIAGNOSIS — F32A Depression, unspecified: Secondary | ICD-10-CM | POA: Diagnosis present

## 2023-06-07 DIAGNOSIS — M25521 Pain in right elbow: Secondary | ICD-10-CM | POA: Diagnosis not present

## 2023-06-07 DIAGNOSIS — F411 Generalized anxiety disorder: Secondary | ICD-10-CM | POA: Diagnosis present

## 2023-06-07 DIAGNOSIS — Z7901 Long term (current) use of anticoagulants: Secondary | ICD-10-CM

## 2023-06-07 DIAGNOSIS — R64 Cachexia: Secondary | ICD-10-CM | POA: Diagnosis present

## 2023-06-07 DIAGNOSIS — S41112A Laceration without foreign body of left upper arm, initial encounter: Secondary | ICD-10-CM | POA: Diagnosis present

## 2023-06-07 DIAGNOSIS — M4644 Discitis, unspecified, thoracic region: Principal | ICD-10-CM

## 2023-06-07 DIAGNOSIS — M4854XD Collapsed vertebra, not elsewhere classified, thoracic region, subsequent encounter for fracture with routine healing: Secondary | ICD-10-CM | POA: Diagnosis present

## 2023-06-07 DIAGNOSIS — S52021A Displaced fracture of olecranon process without intraarticular extension of right ulna, initial encounter for closed fracture: Secondary | ICD-10-CM | POA: Diagnosis not present

## 2023-06-07 DIAGNOSIS — S22080A Wedge compression fracture of T11-T12 vertebra, initial encounter for closed fracture: Secondary | ICD-10-CM | POA: Diagnosis not present

## 2023-06-07 DIAGNOSIS — M40204 Unspecified kyphosis, thoracic region: Secondary | ICD-10-CM | POA: Diagnosis not present

## 2023-06-07 DIAGNOSIS — L89100 Pressure ulcer of unspecified part of back, unstageable: Secondary | ICD-10-CM | POA: Diagnosis not present

## 2023-06-07 DIAGNOSIS — Z8739 Personal history of other diseases of the musculoskeletal system and connective tissue: Secondary | ICD-10-CM

## 2023-06-07 DIAGNOSIS — R262 Difficulty in walking, not elsewhere classified: Secondary | ICD-10-CM | POA: Diagnosis present

## 2023-06-07 DIAGNOSIS — F17210 Nicotine dependence, cigarettes, uncomplicated: Secondary | ICD-10-CM | POA: Diagnosis present

## 2023-06-07 DIAGNOSIS — L89144 Pressure ulcer of left lower back, stage 4: Secondary | ICD-10-CM | POA: Diagnosis present

## 2023-06-07 DIAGNOSIS — S51019A Laceration without foreign body of unspecified elbow, initial encounter: Secondary | ICD-10-CM | POA: Insufficient documentation

## 2023-06-07 DIAGNOSIS — Z7982 Long term (current) use of aspirin: Secondary | ICD-10-CM

## 2023-06-07 DIAGNOSIS — L98421 Non-pressure chronic ulcer of back limited to breakdown of skin: Secondary | ICD-10-CM

## 2023-06-07 DIAGNOSIS — K219 Gastro-esophageal reflux disease without esophagitis: Secondary | ICD-10-CM | POA: Diagnosis present

## 2023-06-07 DIAGNOSIS — L89154 Pressure ulcer of sacral region, stage 4: Secondary | ICD-10-CM

## 2023-06-07 DIAGNOSIS — E1140 Type 2 diabetes mellitus with diabetic neuropathy, unspecified: Secondary | ICD-10-CM | POA: Diagnosis present

## 2023-06-07 DIAGNOSIS — E119 Type 2 diabetes mellitus without complications: Secondary | ICD-10-CM | POA: Diagnosis not present

## 2023-06-07 DIAGNOSIS — L8994 Pressure ulcer of unspecified site, stage 4: Secondary | ICD-10-CM | POA: Diagnosis present

## 2023-06-07 DIAGNOSIS — L89134 Pressure ulcer of right lower back, stage 4: Secondary | ICD-10-CM | POA: Diagnosis present

## 2023-06-07 DIAGNOSIS — E872 Acidosis, unspecified: Secondary | ICD-10-CM | POA: Diagnosis present

## 2023-06-07 DIAGNOSIS — S41111A Laceration without foreign body of right upper arm, initial encounter: Secondary | ICD-10-CM | POA: Diagnosis present

## 2023-06-07 LAB — COMPREHENSIVE METABOLIC PANEL WITH GFR
ALT: 23 U/L (ref 0–44)
AST: 34 U/L (ref 15–41)
Albumin: 2.5 g/dL — ABNORMAL LOW (ref 3.5–5.0)
Alkaline Phosphatase: 69 U/L (ref 38–126)
Anion gap: 9 (ref 5–15)
BUN: 8 mg/dL (ref 8–23)
CO2: 27 mmol/L (ref 22–32)
Calcium: 8.9 mg/dL (ref 8.9–10.3)
Chloride: 102 mmol/L (ref 98–111)
Creatinine, Ser: 0.44 mg/dL (ref 0.44–1.00)
GFR, Estimated: >60 mL/min (ref >60)
Glucose, Bld: 67 mg/dL — ABNORMAL LOW (ref 70–99)
Potassium: 3.5 mmol/L (ref 3.5–5.1)
Sodium: 138 mmol/L (ref 135–145)
Total Bilirubin: 0.9 mg/dL (ref 0.0–1.2)
Total Protein: 5.5 g/dL — ABNORMAL LOW (ref 6.5–8.1)

## 2023-06-07 LAB — GLUCOSE, CAPILLARY: Glucose-Capillary: 102 mg/dL — ABNORMAL HIGH (ref 70–99)

## 2023-06-07 LAB — CBC
HCT: 36.9 % (ref 36.0–46.0)
Hemoglobin: 12.6 g/dL (ref 12.0–15.0)
MCH: 32.1 pg (ref 26.0–34.0)
MCHC: 34.1 g/dL (ref 30.0–36.0)
MCV: 94.1 fL (ref 80.0–100.0)
Platelets: 243 10*3/uL (ref 150–400)
RBC: 3.92 MIL/uL (ref 3.87–5.11)
RDW: 16 % — ABNORMAL HIGH (ref 11.5–15.5)
WBC: 8.3 10*3/uL (ref 4.0–10.5)
nRBC: 0 % (ref 0.0–0.2)

## 2023-06-07 LAB — LACTIC ACID, PLASMA
Lactic Acid, Venous: 0.8 mmol/L (ref 0.5–1.9)
Lactic Acid, Venous: 1.7 mmol/L (ref 0.5–1.9)

## 2023-06-07 LAB — SEDIMENTATION RATE: Sed Rate: 6 mm/h (ref 0–22)

## 2023-06-07 LAB — C-REACTIVE PROTEIN: CRP: 1 mg/dL — ABNORMAL HIGH (ref <1.0)

## 2023-06-07 MED ORDER — ALPRAZOLAM 0.5 MG PO TABS
0.5000 mg | ORAL_TABLET | Freq: Three times a day (TID) | ORAL | Status: DC | PRN
  Administered 2023-06-07: 0.5 mg via ORAL
  Filled 2023-06-07: qty 1

## 2023-06-07 MED ORDER — SODIUM CHLORIDE 0.9 % IV SOLN
8.0000 mg/kg | Freq: Every day | INTRAVENOUS | Status: DC
  Administered 2023-06-07 – 2023-06-09 (×3): 350 mg via INTRAVENOUS
  Filled 2023-06-07 (×3): qty 7

## 2023-06-07 MED ORDER — ACETAMINOPHEN 650 MG RE SUPP: 650.0000 mg | Freq: Four times a day (QID) | RECTAL | Status: DC | PRN

## 2023-06-07 MED ORDER — FLUOXETINE HCL 20 MG PO CAPS
20.0000 mg | ORAL_CAPSULE | Freq: Every day | ORAL | Status: DC
  Administered 2023-06-07 – 2023-06-09 (×3): 20 mg via ORAL
  Filled 2023-06-07 (×3): qty 1

## 2023-06-07 MED ORDER — ALPRAZOLAM 0.5 MG PO TABS
0.5000 mg | ORAL_TABLET | Freq: Four times a day (QID) | ORAL | Status: DC
  Administered 2023-06-07 – 2023-06-09 (×9): 0.5 mg via ORAL
  Filled 2023-06-07 (×9): qty 1

## 2023-06-07 MED ORDER — NICOTINE POLACRILEX 2 MG MT GUM: 2.0000 mg | CHEWING_GUM | OROMUCOSAL | Status: DC | PRN

## 2023-06-07 MED ORDER — SODIUM CHLORIDE 0.9% FLUSH
3.0000 mL | Freq: Two times a day (BID) | INTRAVENOUS | Status: DC
  Administered 2023-06-07 – 2023-06-08 (×3): 3 mL via INTRAVENOUS

## 2023-06-07 MED ORDER — SODIUM CHLORIDE 0.9 % IV SOLN
2.0000 g | INTRAVENOUS | Status: DC
  Administered 2023-06-07 – 2023-06-09 (×3): 2 g via INTRAVENOUS
  Filled 2023-06-07 (×3): qty 20

## 2023-06-07 MED ORDER — SODIUM CHLORIDE 0.9% FLUSH: 3.0000 mL | INTRAVENOUS | Status: DC | PRN

## 2023-06-07 MED ORDER — SODIUM CHLORIDE 0.9 % IV SOLN: 250.0000 mL | INTRAVENOUS | Status: AC | PRN

## 2023-06-07 MED ORDER — NICOTINE 21 MG/24HR TD PT24
21.0000 mg | MEDICATED_PATCH | Freq: Every day | TRANSDERMAL | Status: DC
Start: 2023-06-07 — End: 2023-06-09
  Administered 2023-06-07 – 2023-06-09 (×3): 21 mg via TRANSDERMAL
  Filled 2023-06-07 (×3): qty 1

## 2023-06-07 MED ORDER — ONDANSETRON HCL 4 MG/2ML IJ SOLN: 4.0000 mg | Freq: Four times a day (QID) | INTRAMUSCULAR | Status: DC | PRN

## 2023-06-07 MED ORDER — ATORVASTATIN CALCIUM 10 MG PO TABS
10.0000 mg | ORAL_TABLET | Freq: Every day | ORAL | Status: DC
  Administered 2023-06-07 – 2023-06-09 (×3): 10 mg via ORAL
  Filled 2023-06-07 (×3): qty 1

## 2023-06-07 MED ORDER — ACETAMINOPHEN 325 MG PO TABS
650.0000 mg | ORAL_TABLET | Freq: Four times a day (QID) | ORAL | Status: DC | PRN
  Administered 2023-06-08: 650 mg via ORAL
  Filled 2023-06-07: qty 2

## 2023-06-07 MED ORDER — PANTOPRAZOLE SODIUM 40 MG PO TBEC
40.0000 mg | DELAYED_RELEASE_TABLET | Freq: Every day | ORAL | Status: DC
  Administered 2023-06-07 – 2023-06-09 (×3): 40 mg via ORAL
  Filled 2023-06-07 (×3): qty 1

## 2023-06-07 MED ORDER — ONDANSETRON HCL 4 MG PO TABS: 4.0000 mg | ORAL_TABLET | Freq: Four times a day (QID) | ORAL | Status: DC | PRN

## 2023-06-07 MED ORDER — VANCOMYCIN HCL 500 MG/100ML IV SOLN: 500.0000 mg | INTRAVENOUS | Status: DC

## 2023-06-07 MED ORDER — PIPERACILLIN-TAZOBACTAM 3.375 G IVPB
3.3750 g | Freq: Three times a day (TID) | INTRAVENOUS | Status: DC
Start: 2023-06-07 — End: 2023-06-07
  Administered 2023-06-07: 3.375 g via INTRAVENOUS
  Filled 2023-06-07: qty 50

## 2023-06-07 MED ORDER — LACTATED RINGERS IV SOLN: INTRAVENOUS | Status: AC

## 2023-06-07 MED ORDER — ENOXAPARIN SODIUM 300 MG/3ML IJ SOLN
20.0000 mg | INTRAMUSCULAR | Status: DC
  Administered 2023-06-07 – 2023-06-09 (×3): 20 mg via SUBCUTANEOUS
  Filled 2023-06-07 (×3): qty 0.2

## 2023-06-07 MED ORDER — ZINC OXIDE 40 % EX OINT
1.0000 | TOPICAL_OINTMENT | Freq: Two times a day (BID) | CUTANEOUS | Status: DC
  Administered 2023-06-07 – 2023-06-09 (×5): 1 via TOPICAL
  Filled 2023-06-07 (×2): qty 57

## 2023-06-07 MED ORDER — BUDESON-GLYCOPYRROL-FORMOTEROL 160-9-4.8 MCG/ACT IN AERO
2.0000 | INHALATION_SPRAY | Freq: Two times a day (BID) | RESPIRATORY_TRACT | Status: DC
  Administered 2023-06-07 – 2023-06-09 (×5): 2 via RESPIRATORY_TRACT
  Filled 2023-06-07: qty 5.9

## 2023-06-07 NOTE — Consult Note (Addendum)
 WOC Nurse Consult Note: Reason for Consult: chronic Thoracic ulcer and B upper extremity skin tears  Wound type: 1. Stage 4 pressure injury thoracic spine  2.  Full thickness skin loss B upper arms r/t trauma (skin tear)  Pressure Injury POA: Yes Measurement: see nursing flowsheet  Wound bed:1.  Thoracic ulcer appears clean, some tan fibrinous (does not appear necrotic) tissue  2.  Skin tears red moist  Drainage (amount, consistency, odor) serosanguinous on old dressing  Periwound: mild erythema  Dressing procedure/placement/frequency:  Cleanse thoracic  wound with Vashe wound cleanser Timm Foot [phone]) do not rinse and allow to air dry. Using a Q tip applicator apply Vashe moistened Kerlix roll gauze to wound bed making sure to cover any depth and/or undermining.  Cover with dry gauze and silicone foam or ABD pad.  Cleanse B upper arm skin tears with saline, apply Xeroform gauze Timm Foot [phone] to wounds every other day, cover with Telfa nonstick pads then Kerlix roll gauze and tape.  SOAK OFF OLD XEROFORM WITH SALINE IF STUCK TO WOUND BED FOR ATRAUMATIC REMOVAL.   Patient should be placed on a low air loss mattress for pressure redistribution and moisture management.    POC discussed with bedside nurse. WOC team will not follow. Re-consult if further needs arise.   Thank you,    Ronni Colace MSN, RN-BC, [employer] [phone]

## 2023-06-07 NOTE — Consult Note (Signed)
 [facility]    Date of Admission:  06/07/2023     Total days of antibiotics                Reason for Consult: Discitis     Referring Provider: Dr. Haywood Lisle  Primary Care Provider: Leesa Pulling, MD   ASSESSMENT:  Tiffany Mcdaniel is a 83 year old Caucasian female esophageal cancer and kyphosis status post kyphoplasty in 2023 complicated by development of a pressure ulcer of the thoracic spine now admitted with concern for infection.  Imaging concerning for osteomyelitis with bone clearly visible on exam.  Blood cultures drawn on admission without growth to date.  Inflammatory markers mildly elevated.  Discussed recommended plan of care for IV antibiotics with daptomycin and ceftriaxone  for [redacted] weeks along with wound care.  Therapeutic drug monitoring of CK levels while on daptomycin.  Will need PICC line placed prior to discharge.  No current surgical interventions indicated.  Emphasized the importance of nutritional intake and site offloading if this has any chance of healing.  May benefit from low-air-loss mattress bed.  Currently she appears cachectic with a low albumin.  Would recommend nutrition consult to optimize nutritional intake as able and possibly palliative care to discuss goals of care.  Continue standard/universal precautions.  Remaining medical and supportive care per internal medicine.    PLAN:  Change antibiotics to daptomycin and ceftriaxone . Therapeutic drug monitoring of CK levels while on daptomycin. Will need PICC line placed prior to discharge. Optimize nutrition and site offloading. Wound care per wound RN recommendations. Consider palliative care for goals of care. Continue standard/universal precautions. Remaining medical and supportive care per internal medicine.   Principal Problem:   Thoracic discitis Active Problems:   Discitis of thoracic region   History of esophageal cancer   Non-insulin  dependent type 2 diabetes mellitus  (HCC)   Generalized anxiety disorder   Skin tear of elbow without complication   Hypercalcemia    ALPRAZolam  0.5 mg Oral QID   atorvastatin  10 mg Oral Daily   budeson-glycopyrrolate-formoterol  2 puff Inhalation BID   enoxaparin (LOVENOX) injection  20 mg Subcutaneous Q24H   FLUoxetine  20 mg Oral Daily   liver oil-zinc oxide  1 Application Topical BID   nicotine  21 mg Transdermal Daily   pantoprazole  40 mg Oral Daily   sodium chloride  flush  3 mL Intravenous Q12H     HPI: Tiffany Mcdaniel is a 83 y.o. female with previous medical history of esophageal cancer, T12 compression fracture, type 2 diabetes, and chronic inferior thoracic ulcer presenting to [facility] for concern of infected stage IV decubitus ulcer on the mid back.  Tiffany Mcdaniel was initially seen at [facility] with concern for infection of her stage IV decubitus ulcer on her mid back.  Also noted to have skin tears on her L wrist.  MRI thoracic spine with T11-T12 intervertebral disc narrowing and small volume of fluid with discitis not being excluded and no definite signal of osteomyelitis at T11.  Blood cultures drawn are without growth to date.  Did receive a dose of vancomycin and ceftriaxone .  Afebrile on arrival with no leukocytosis.  Blood cultures drawn 4/28 remain pending.  Wound ostomy evaluated stage IV pressure ulcer and full-thickness skin loss in bilateral upper arms with wound care recommendations.  Significant lab work noted with albumin of 2.5.  Current antibiotics are vancomycin and piperacillin-tazobactam.  Neurosurgery consulted with no surgical interventions  indicated.  Tiffany Mcdaniel lives at home with her husband and special needs child.  She ambulates periodically walking up and down the house with the aid of a walker generally sitting most of the time.  Has limited ability to complete instrumental activities of daily living.  Unclear as to how long the wound has been present.  Has noticed increased  drainage over the past week.  Denies fevers, chills, or sweats.   Review of Systems: Review of Systems  Constitutional:  Positive for weight loss. Negative for chills and fever.       Decreased appetite  Respiratory:  Negative for cough, shortness of breath and wheezing.   Cardiovascular:  Negative for chest pain and leg swelling.  Gastrointestinal:  Negative for abdominal pain, constipation, diarrhea, nausea and vomiting.  Skin:  Negative for rash.     Past Medical History:  Diagnosis Date   Anxiety    Arthritis    Cancer (HCC)    Diabetes mellitus without complication (HCC)    Dyspnea    GERD (gastroesophageal reflux disease)     Social History   Tobacco Use   Smoking status: Every Day    Current packs/day: 1.00    Types: Cigarettes   Smokeless tobacco: Never    History reviewed. No pertinent family history.  No Known Allergies  OBJECTIVE: Blood pressure (!) 146/95, pulse 88, temperature 98 F (36.7 C), resp. rate 16, height 5\' 6"  (1.676 m), weight 41.6 kg, SpO2 97%.  Physical Exam Constitutional:      General: She is not in acute distress.    Appearance: She is well-developed. She is cachectic. She is ill-appearing.     Comments: Lying in bed; hard of hearing; pleasant  Cardiovascular:     Rate and Rhythm: Normal rate and regular rhythm.     Heart sounds: Normal heart sounds.  Pulmonary:     Effort: Pulmonary effort is normal.     Breath sounds: Normal breath sounds.  Skin:    General: Skin is warm and dry.  Neurological:     Mental Status: She is alert and oriented to person, place, and time.  Psychiatric:        Behavior: Behavior normal.        Thought Content: Thought content normal.        Judgment: Judgment normal.     Lab Results Lab Results  Component Value Date   WBC 8.3 06/07/2023   HGB 12.6 06/07/2023   HCT 36.9 06/07/2023   MCV 94.1 06/07/2023   PLT 243 06/07/2023    Lab Results  Component Value Date   CREATININE 0.44 06/07/2023    BUN 8 06/07/2023   NA 138 06/07/2023   K 3.5 06/07/2023   CL 102 06/07/2023   CO2 27 06/07/2023    Lab Results  Component Value Date   ALT 23 06/07/2023   AST 34 06/07/2023   ALKPHOS 69 06/07/2023   BILITOT 0.9 06/07/2023     Microbiology: No results found for this or any previous visit (from the past 240 hours).   Greg Katyra Tomassetti, NP [facility]  06/07/2023  11:43 AM

## 2023-06-07 NOTE — Consult Note (Signed)
 CC: skin ulcer  HPI:     Patient is a 83 y.o. female with DM2, hx of esophageal cancer, hx of T12 compression fx s/p kyphoplasty p/w large ulcer on her mid back.  On question, she has some back pain on and off, no severe pain.  MRI was done at [facility] showed chronic T12 compression fracture, T11 compression fracture (radiologist did not comment on chronicity), disc narrowing at T11-12 with some fluid.    Patient Active Problem List   Diagnosis Date Noted   Discitis of thoracic region 06/07/2023   History of esophageal cancer 06/07/2023   Non-insulin  dependent type 2 diabetes mellitus (HCC) 06/07/2023   Generalized anxiety disorder 06/07/2023   Thoracic discitis 06/07/2023   Skin tear of elbow without complication 06/07/2023   Hypercalcemia 06/07/2023   Past Medical History:  Diagnosis Date   Anxiety    Arthritis    Cancer (HCC)    Diabetes mellitus without complication (HCC)    Dyspnea    GERD (gastroesophageal reflux disease)     Past Surgical History:  Procedure Laterality Date   APPENDECTOMY     EYE SURGERY     bilateral cataracts   KYPHOPLASTY N/A 04/28/2021   Procedure: KYPHOPLASTY [facility] TWELVE;  Surgeon: Van Gelinas, MD;  Location: [facility] OR;  Service: Neurosurgery;  Laterality: N/A;   TUBAL LIGATION      Medications Prior to Admission  Medication Sig Dispense Refill Last Dose/Taking   BREZTRI AEROSPHERE 160-9-4.8 MCG/ACT AERO inhaler Inhale into the lungs.   Taking   doxycycline (VIBRAMYCIN) 100 MG capsule Take 100 mg by mouth 2 (two) times daily.   Taking   FLUoxetine (PROZAC) 20 MG capsule Take 20 mg by mouth daily.   Taking   acetaminophen  (TYLENOL ) 500 MG tablet Take 500 mg by mouth every 6 (six) hours as needed (pain.).      ALPRAZolam (XANAX) 0.5 MG tablet Take 0.5 mg by mouth every 8 (eight) hours as needed for anxiety.      Aromatic Inhalants (VAPOR INHALER IN) Inhale 1 spray into the lungs as needed (congestion.).      aspirin 81 MG chewable  tablet Chew by mouth daily.      atorvastatin (LIPITOR) 10 MG tablet Take 10 mg by mouth in the morning.      Cholecalciferol (VITAMIN D3) 125 MCG (5000 UT) TABS Take 5,000 Units by mouth in the morning.      famotidine (PEPCID) 20 MG tablet Take 1 tablet by mouth 2 (two) times daily.      omeprazole (PRILOSEC) 40 MG capsule Take 40 mg by mouth in the morning.      No Known Allergies  Social History   Tobacco Use   Smoking status: Every Day    Current packs/day: 1.00    Types: Cigarettes   Smokeless tobacco: Never  Substance Use Topics   Alcohol use: Not on file    History reviewed. No pertinent family history.   Review of Systems Pertinent items are noted in HPI.  Objective:   Patient Vitals for the past 8 hrs:  BP Temp Temp src Pulse Resp SpO2 Height Weight  06/07/23 0750 (!) 146/95 98 F (36.7 C) -- 88 16 97 % -- --  06/07/23 0500 -- -- -- -- -- -- 5\' 6"  (1.676 m) 41.6 kg  06/07/23 0415 133/86 98.6 F (37 C) Oral 94 17 96 % -- --   No intake/output data recorded. No intake/output data recorded.    Cachectic  MAEs, no gross neurologic deficit Large pressure ulcer over mid-back, no drainage  Data ReviewCBC:  Lab Results  Component Value Date   WBC 8.3 06/07/2023   RBC 3.92 06/07/2023   BMP:  Lab Results  Component Value Date   GLUCOSE 67 (L) 06/07/2023   CO2 27 06/07/2023   BUN 8 06/07/2023   CREATININE 0.44 06/07/2023   CALCIUM 8.9 06/07/2023   Radiology review:  Imaging unavailable  Assessment:   Principal Problem:   Thoracic discitis Active Problems:   Discitis of thoracic region   History of esophageal cancer   Non-insulin  dependent type 2 diabetes mellitus (HCC)   Generalized anxiety disorder   Skin tear of elbow without complication   Hypercalcemia  Failure-to-thrive, large pressure ulcer on mid-back, hx of osteoporotic compression fractures  Plan:   -  Would recommend obtaining/uploading MRI done at [facility] so it can be reviewed.  However,  based on her clinical symptomatology, there is little reason to suspect thoracic discitis. Regardless, no neurosurgical intervention indicated.

## 2023-06-07 NOTE — Progress Notes (Signed)
 Pharmacy Antibiotic Note  Tiffany Mcdaniel is a 83 y.o. female admitted on 06/07/2023 with thoracic discitis . Pharmacy has been consulted for vancomycin dosing.  Pt has history of esophageal cancer, T2DM, and chronic thoracic ulcer. Empirically started on vanc/rocephin  at OSH and zosyn on presentation to ED- due to age, will switch vanc over to dapto. Remains afebrile, WBC 8.1, LAC 0.8>1.7.  Plan: - Switch vanc/zosyn > daptomycin 350 mg (8 mg/kg) IV q24h and rocephin  2g IV q24h - Follow blood cultures and LOT, monitor CK.  Height: 5\' 6"  (167.6 cm) Weight: 41.6 kg (91 lb 11.4 oz) (Wt 4/28 at [facility]) IBW/kg (Calculated) : 59.3  Temp (24hrs), Avg:98 F (36.7 C), Min:97.5 F (36.4 C), Max:98.6 F (37 C)  Recent Labs  Lab 06/07/23 0550 06/07/23 0832  WBC 8.3  --   CREATININE 0.44  --   LATICACIDVEN 0.8 1.7    Estimated Creatinine Clearance: 34.4 mL/min (by C-G formula based on SCr of 0.44 mg/dL).    No Known Allergies  Antimicrobials this admission: Vanc 4/28 (OSH)>> Rocephin  4/28 (OSH) Zosyn 4/29 >>  Microbiology results: 4/29 BCx: pending   Thank you for allowing pharmacy to be a part of this patient's care.  Argentina Bees 06/07/2023 10:27 AM

## 2023-06-07 NOTE — Plan of Care (Signed)

## 2023-06-07 NOTE — H&P (Signed)
 History and Physical    Tiffany Mcdaniel:[id number] DOB: [birth year] DOA: 06/07/2023  PCP: Leesa Pulling, MD   Patient coming from: Home   Chief Complaint: Thoracic vertebral area ulcer   HPI:  Tiffany Mcdaniel is a 83 y.o. female with medical history significant of history of esophageal cancer, T12 compressive fracture, DM type II, generalized anxiety disorder, chronic inferior thoracic ulcer and bilateral upper extremities superficial skin tear who has been transferred from [facility] to [facility] for concern for infected thoracic vertebral decilities. Patient also has chronic TER of the elbow and wrist which has been previously wrapped by home health.  EMS readapt the wound with Xeroform and applied Kerlex.  There is no active bleeding of the wound.   At presentation to [facility] emergency department patient is hemodynamically stable blood pressure 145/99 heart rate 54, afebrile, respiratory 18 O2 sat 93% room air.  At presentation to ED patient is hemodynamically stable. Lab work showed elevated CRP 11.9, and ESR 20.  Elevated lactic acid 2. CBC showing leukocytosis 10.Aaron Aas CMP unremarkable except slightly better calcium 10.2 and low albumin 3.1.  MRI finding following: 1. T11-12 intervertebral disc is narrowed and has small volume fluid. Discitis at this level is not excluded. 2. T12 chronic severe compression deformity. Left T12 paraspinous prominent osteophyte. 3. T11 vertebral body interval anterior wedge compression deformity with greater than 75 percent loss of height. No definite signal abnormality suggest osteomyelitis. 4. Moderate left pleural effusion.    In the ED patient has been treated with ceftriaxone  and vancomycin.  Patient also received 1 L of LR bolus and Ativan 1 mg before MRI.  ED physician at [facility] has been consulted neurosurgery Arlis Bent who will notify pt's NS Dr Andy Bannister and will consult.    As neurosurgery has been accepted this patient he  has been transferred to [facility] for further management of thoracic vertebral discitis and infected ulcer.  During my evaluation at the bedside patient found lying comfortably in the bed.  She is denying any particular area pain, fever, and chills. Daughter at the bedside reported that patient has very fragile skin with bilateral upper extremities skin tear and bruising very easily. Patient also lost around 90 kg over the last 1 year due due to very poor appetite.  Patient seems very depressed and flat affect.  Update, spoke with on-call neurosurgery Gearline Kell, PA who stated that given patient does not have any vertebral abscess there is no need for neurosurgery to be involved in this case.  Patient can be managed by medicine team.    Significant labs in the ED: Lab Orders         Culture, blood (Routine X 2) w Reflex to ID Panel         Glucose, capillary         CBC         Comprehensive metabolic panel         Comprehensive metabolic panel         CBC         Sedimentation rate         C-reactive protein         Lactic acid, plasma       Review of Systems:  Review of Systems  Constitutional:  Positive for malaise/fatigue and weight loss. Negative for chills and fever.  Respiratory:  Negative for cough.   Cardiovascular:  Negative for chest pain and palpitations.  Gastrointestinal:  Negative for abdominal pain, constipation, diarrhea, heartburn, nausea and vomiting.  Musculoskeletal:  Positive for back pain. Negative for falls, joint pain, myalgias and neck pain.  Neurological:  Negative for dizziness and headaches.  Psychiatric/Behavioral:  Positive for depression. The patient is not nervous/anxious.     Past Medical History:  Diagnosis Date   Anxiety    Arthritis    Cancer (HCC)    Diabetes mellitus without complication (HCC)    Dyspnea    GERD (gastroesophageal reflux disease)     Past Surgical History:  Procedure Laterality Date   APPENDECTOMY     EYE  SURGERY     bilateral cataracts   KYPHOPLASTY N/A 04/28/2021   Procedure: KYPHOPLASTY [facility] TWELVE;  Surgeon: Van Gelinas, MD;  Location: [facility] OR;  Service: Neurosurgery;  Laterality: N/A;   TUBAL LIGATION       reports that she has been smoking cigarettes. She has never used smokeless tobacco. No history on file for alcohol use and drug use.  No Known Allergies  History reviewed. No pertinent family history.  Prior to Admission medications   Medication Sig Start Date End Date Taking? Authorizing Provider  acetaminophen  (TYLENOL ) 500 MG tablet Take 500 mg by mouth every 6 (six) hours as needed (pain.).    [provider]  ALPRAZolam (XANAX) 0.5 MG tablet Take 0.5 mg by mouth every 8 (eight) hours as needed for anxiety. 03/25/21   [provider]  Aromatic Inhalants (VAPOR INHALER IN) Inhale 1 spray into the lungs as needed (congestion.).    [provider]  aspirin 81 MG chewable tablet Chew by mouth daily.    [provider]  atorvastatin (LIPITOR) 10 MG tablet Take 10 mg by mouth in the morning. 12/19/20   [provider]  Cholecalciferol (VITAMIN D3) 125 MCG (5000 UT) TABS Take 5,000 Units by mouth in the morning.    [provider]  omeprazole (PRILOSEC) 40 MG capsule Take 40 mg by mouth in the morning. 03/31/21   [provider]     Physical Exam: Vitals:   06/07/23 0148 06/07/23 0415 06/07/23 0500  BP: (!) 159/95 133/86   Pulse: 97 94   Resp: 17 17   Temp: (!) 97.5 F (36.4 C) 98.6 F (37 C)   TempSrc: Oral Oral   SpO2: (!) 75% 96%   Weight:   41.6 kg  Height:   5\' 6"  (1.676 m)    Physical Exam Constitutional:      General: She is not in acute distress.    Appearance: She is ill-appearing. She is not toxic-appearing.     Comments: Cachectic appearance  HENT:     Mouth/Throat:     Mouth: Mucous membranes are moist.  Cardiovascular:     Rate and Rhythm: Normal rate and regular rhythm.     Pulses:  Normal pulses.     Heart sounds: Normal heart sounds.  Pulmonary:     Breath sounds: Normal breath sounds.  Abdominal:     Palpations: Abdomen is soft.  Musculoskeletal:     Cervical back: Neck supple.     Right lower leg: No edema.     Left lower leg: No edema.  Skin:    General: Skin is dry.     Capillary Refill: Capillary refill takes less than 2 seconds.  Neurological:     Mental Status: She is alert and oriented to person, place, and time.  Psychiatric:  Mood and Affect: Mood is depressed.        Speech: Speech normal.        Behavior: Behavior normal.        Thought Content: Thought content normal.        Cognition and Memory: Memory normal.      Labs on Admission: I have personally reviewed following labs and imaging studies  CBC: No results for input(s): "WBC", "NEUTROABS", "HGB", "HCT", "MCV", "PLT" in the last 168 hours. Basic Metabolic Panel: No results for input(s): "NA", "K", "CL", "CO2", "GLUCOSE", "BUN", "CREATININE", "CALCIUM", "MG", "PHOS" in the last 168 hours. GFR: CrCl cannot be calculated (Patient's most recent lab result is older than the maximum 21 days allowed.). Liver Function Tests: No results for input(s): "AST", "ALT", "ALKPHOS", "BILITOT", "PROT", "ALBUMIN" in the last 168 hours. No results for input(s): "LIPASE", "AMYLASE" in the last 168 hours. No results for input(s): "AMMONIA" in the last 168 hours. Coagulation Profile: No results for input(s): "INR", "PROTIME" in the last 168 hours. Cardiac Enzymes: No results for input(s): "CKTOTAL", "CKMB", "CKMBINDEX", "TROPONINI", "TROPONINIHS" in the last 168 hours. BNP (last 3 results) No results for input(s): "BNP" in the last 8760 hours. HbA1C: No results for input(s): "HGBA1C" in the last 72 hours. CBG: Recent Labs  Lab 06/07/23 0158  GLUCAP 102*   Lipid Profile: No results for input(s): "CHOL", "HDL", "LDLCALC", "TRIG", "CHOLHDL", "LDLDIRECT" in the last 72 hours. Thyroid  Function  Tests: No results for input(s): "TSH", "T4TOTAL", "FREET4", "T3FREE", "THYROIDAB" in the last 72 hours. Anemia Panel: No results for input(s): "VITAMINB12", "FOLATE", "FERRITIN", "TIBC", "IRON", "RETICCTPCT" in the last 72 hours. Urine analysis: No results found for: "COLORURINE", "APPEARANCEUR", "LABSPEC", "PHURINE", "GLUCOSEU", "HGBUR", "BILIRUBINUR", "KETONESUR", "PROTEINUR", "UROBILINOGEN", "NITRITE", "LEUKOCYTESUR"  Radiological Exams on Admission: I have personally reviewed images No results found.   EKG: Pending EKG.    Assessment/Plan: Principal Problem:   Thoracic discitis Active Problems:   Discitis of thoracic region   History of esophageal cancer   Non-insulin  dependent type 2 diabetes mellitus (HCC)   Generalized anxiety disorder   Skin tear of elbow without complication   Hypercalcemia    Assessment and Plan: Thoracic vertebral discitis Chronic ulcer of the thoracic vertebral region Chronic T12 vertebral body compression fracture T11 vertebral compression fracture and deformity -Patient has been transferred from [facility] to [facility] for neurosurgery evaluation. - History of thoracic vertebral compression fracture and chronic thoracic vertebral ulcer presented to emergency department worsening neuropathy particularly. - At presentation to ED patient is hemodynamically stable - CBC showing leukocytosis, elevated lactic aci acid, ESR and CRP level. - The ED patient has been treated with IV vancomycin and ceftriaxone . - MRI showed T8-T12 intervertebral disc narrowed with a small volume of fluid and concern for discitis. T12 chronic severe compression deformity. Left T12 paraspinous prominent osteophyte. T11 vertebral body interval anterior wedge compression deformity with greater than 75 percent loss of height. No definite signal abnormality suggest osteomyelitis.  -Neurosurgery was consulted while patient was at [facility] and transferred to [facility]  for further evaluation.  Informed on-call neuro surgery in the morning. -Plan to continue IV vancomycin and cefepime. - Obtaining CBC, CMP, ESR, CRP and blood cultures.  Rechecking lactic acid level. - Starting maintenance fluid LR 125 cc/h. Addendum - Neurosurgery called back Kim Myern stated that given patient does not have any vertebral abscess there is no need for neurosurgery need to be involved in this case.  Medicine team need to  decide about the duration of the IV antibiotic. -Please consult ID in the daytime for further recommendation.    Non-insulin -dependent DM type II -Daughter at the bedside reported that patient is not any insulin  regimen current antidiabetic regimen.  Diabetes currently diet controlled.  Generalized anxiety disorder  -Continue Xanax as needed  Depression - Patient reported sleep disturbance, decreased interest with daily activity, low energy, decreased concentration, poor appetite and lost significant weight due to poor appetite.  Patient does not have any suicidal ideation. -Consulting inpatient psychiatry for management for depression.  Skin tear of the bilateral upper extremity Thoracic vertebral T12 area ulcer -Consulted wound care  Hypercalcemia secondary to immobilization - Continue LR 125 cc/h.  History of esophageal adenocarcinoma 2008 -Patient follows gastroenterology and radiation oncology with [facility].   DVT prophylaxis:  SCDs.  Continue Lovenox. Code Status:  Full Code Diet: Heart healthy carb modified diet Family Communication:   Family was present at bedside, at the time of interview. Opportunity was given to ask question and all questions were answered satisfactorily.  Disposition Plan: Need to follow-up with blood culture result and infection disease recommendation. Consults: Neurosurgery and infectious disease Admission status:   Inpatient, Telemetry bed  Severity of Illness: The appropriate patient status for this patient  is INPATIENT. Inpatient status is judged to be reasonable and necessary in order to provide the required intensity of service to ensure the patient's safety. The patient's presenting symptoms, physical exam findings, and initial radiographic and laboratory data in the context of their chronic comorbidities is felt to place them at high risk for further clinical deterioration. Furthermore, it is not anticipated that the patient will be medically stable for discharge from the hospital within 2 midnights of admission.   * I certify that at the point of admission it is my clinical judgment that the patient will require inpatient hospital care spanning beyond 2 midnights from the point of admission due to high intensity of service, high risk for further deterioration and high frequency of surveillance required.Aaron Aas    Tori Cupps, MD [facility]  How to contact the [facility] Attending or Consulting provider 7A - 7P or covering provider during after hours 7P -7A, for this patient.  Check the care team in [facility] and look for a) attending/consulting [facility] provider listed and b) the [facility] team listed Log into [email or web address] and use [place]'s universal password to access. If you do not have the password, please contact the hospital operator. Locate the [facility] provider you are looking for under [facility] and page to a number that you can be directly reached. If you still have difficulty reaching the provider, please page the [facility] (Director on Call) for the Hospitalists listed on amion for assistance.  06/07/2023, 6:21 AM

## 2023-06-07 NOTE — Progress Notes (Signed)
 Seen after midnight  83 year old female DM2 prior to GAD upper extremity skin tears?  Depression-recent weight loss Known esophageal cancer -previously followed at [facility] Dr. Alise Iron to follow-up -T12 compression fracture followed by Dr. Andy Bannister status post palliative T12 kyphoplasty 04/28/2021  Presented to [facility] ED skin breakdown with severe stasis ulcer/stage IV decubitus mid back --MRI showed T8-T12 intervertebral disc narrowing small amount of fluid concern discitis, chronic T12 vertebral body compression fracture T11 vertebral compression fracture deformity WBC 10.7 hemoglobin 15 ESR 20--sodium 136 BUN/creatinine 14/0.8--lactic acid 2.9--CRP 11.9  Neurosurgery PA consult-no vertebral abscess no needs neurosurgery involvement-started on IV vancomycin, cefepime fluids 125 cc/H  BP (!) 146/95 (BP Location: Right Arm)   Pulse 88   Temp 98 F (36.7 C)   Resp 16   Ht 5\' 6"  (1.676 m)   Wt 41.6 kg Comment: Wt 4/28 at [facility]  SpO2 97%   BMI 14.80 kg/m  Awake alert coherent but NAD S1 s2 no m/r/g Abd soft nt nd no rebound          Wound known since April 15th and was a litte white spot--was given doxy and referred to Wound clinic and because backed up till may 7th and was reviewed by PCP and referred to ED At baseline she is able to do some ADL's and IADLS Had a hematoma on leg couple of months ago whic limits her and rollator and walker  and has been using  rollator since 1 year past For the past several weeks has been sitting in chair and pretty immobile for a few weeks  Daughter states would be full code   P Antibiotics as per ID-May need long-term--currently daptomycin and ceftriaxone  duration TBD Dressing procedure/placement/frequency:  Cleanse thoracic  wound with Vashe wound cleanser Timm Foot [phone]) do not rinse and allow to air dry. Using a Q tip applicator apply Vashe moistened Kerlix roll gauze to wound bed making sure  to cover any depth and/or undermining.  Cover with dry gauze and silicone foam or ABD pad.  Cleanse B upper arm skin tears with saline, apply Xeroform gauze Timm Foot [phone]) to wounds every other day, cover with Telfa nonstick pads then Kerlix roll gauze and tape.  SOAK OFF OLD XEROFORM WITH SALINE IF STUCK TO WOUND BED FOR ATRAUMATIC REMOVAL.   Will order nicotine patch-resume home inhalers keep on monitors today Continue Xanax 0.5 4 times daily Prozac 20, continue saline cut back rate to 50 cc/H  No charge Abbie Hock, MD [facility] 11:00 AM

## 2023-06-07 NOTE — Progress Notes (Signed)
 Pharmacy Antibiotic Note  Tiffany Mcdaniel is a 83 y.o. female admitted on 06/07/2023 with thoracic discitis per MRI.  Pharmacy has been consulted for zosyn/vancomycin dosing.  -Vancomycin 1500mg  IV x1, Ceftriaxone  2g IV x1 at [facility]  -WBC 10.7, afebrile, sCr 0.82 -Blood cultures ordered   Plan: -Zosyn 3.375gm IV every 8 hours -Vancomycin 1500mg  IV x1 (4/28 @2100 ) -Vancomycin 500mg  IV every 24 hours (AUC 517, Vd 0.72, TBW, sCr 0.82) -Monitor renal function -Follow up signs of clinical improvement, LOT, de-escalation of antibiotics   Height: 5\' 6"  (167.6 cm) Weight: 41.6 kg (91 lb 11.4 oz) (Wt 4/28 at [facility]) IBW/kg (Calculated) : 59.3  Temp (24hrs), Avg:98.1 F (36.7 C), Min:97.5 F (36.4 C), Max:98.6 F (37 C)  No Known Allergies  Antimicrobials this admission: Zosyn 4/29 >>  Vancomycin 4/28 >>   Microbiology results: 4/29 BCx:   Thank you for allowing pharmacy to be a part of this patient's care.  Young Hensen, PharmD. Clinical Pharmacist 06/07/2023 5:27 AM

## 2023-06-07 NOTE — Progress Notes (Incomplete)
 PHARMACY CONSULT NOTE FOR:  OUTPATIENT  PARENTERAL ANTIBIOTIC THERAPY (OPAT)  Indication:  Regimen:  End date: *** (6 weeks)  IV antibiotic discharge orders are pended. To discharging provider:  please sign these orders via discharge navigator,  Select New Orders & click on the button choice - Manage This Unsigned Work.     Thank you for allowing pharmacy to be a part of this patient's care.  Tiffany Mcdaniel 06/07/2023, 10:28 AM

## 2023-06-08 ENCOUNTER — Inpatient Hospital Stay (HOSPITAL_COMMUNITY)

## 2023-06-08 ENCOUNTER — Other Ambulatory Visit: Payer: Self-pay

## 2023-06-08 DIAGNOSIS — M4644 Discitis, unspecified, thoracic region: Secondary | ICD-10-CM | POA: Diagnosis not present

## 2023-06-08 DIAGNOSIS — L89100 Pressure ulcer of unspecified part of back, unstageable: Secondary | ICD-10-CM | POA: Diagnosis not present

## 2023-06-08 DIAGNOSIS — M4624 Osteomyelitis of vertebra, thoracic region: Secondary | ICD-10-CM | POA: Diagnosis not present

## 2023-06-08 DIAGNOSIS — M40204 Unspecified kyphosis, thoracic region: Secondary | ICD-10-CM

## 2023-06-08 LAB — COMPREHENSIVE METABOLIC PANEL WITH GFR
ALT: 35 U/L (ref 0–44)
AST: 43 U/L — ABNORMAL HIGH (ref 15–41)
Albumin: 2.5 g/dL — ABNORMAL LOW (ref 3.5–5.0)
Alkaline Phosphatase: 74 U/L (ref 38–126)
Anion gap: 9 (ref 5–15)
BUN: 9 mg/dL (ref 8–23)
CO2: 29 mmol/L (ref 22–32)
Calcium: 9.1 mg/dL (ref 8.9–10.3)
Chloride: 98 mmol/L (ref 98–111)
Creatinine, Ser: 0.56 mg/dL (ref 0.44–1.00)
GFR, Estimated: >60 mL/min (ref >60)
Glucose, Bld: 89 mg/dL (ref 70–99)
Potassium: 4.3 mmol/L (ref 3.5–5.1)
Sodium: 136 mmol/L (ref 135–145)
Total Bilirubin: 0.9 mg/dL (ref 0.0–1.2)
Total Protein: 5.6 g/dL — ABNORMAL LOW (ref 6.5–8.1)

## 2023-06-08 LAB — CBC
HCT: 39.5 % (ref 36.0–46.0)
Hemoglobin: 13.2 g/dL (ref 12.0–15.0)
MCH: 31.7 pg (ref 26.0–34.0)
MCHC: 33.4 g/dL (ref 30.0–36.0)
MCV: 95 fL (ref 80.0–100.0)
Platelets: 229 10*3/uL (ref 150–400)
RBC: 4.16 MIL/uL (ref 3.87–5.11)
RDW: 16.1 % — ABNORMAL HIGH (ref 11.5–15.5)
WBC: 9.4 10*3/uL (ref 4.0–10.5)
nRBC: 0 % (ref 0.0–0.2)

## 2023-06-08 LAB — CK: Total CK: 109 U/L (ref 38–234)

## 2023-06-08 MED ORDER — CHLORHEXIDINE GLUCONATE CLOTH 2 % EX PADS
6.0000 | MEDICATED_PAD | Freq: Every day | CUTANEOUS | Status: DC
  Administered 2023-06-08 – 2023-06-09 (×2): 6 via TOPICAL

## 2023-06-08 MED ORDER — SODIUM CHLORIDE 0.9% FLUSH
10.0000 mL | INTRAVENOUS | Status: DC | PRN
  Administered 2023-06-08 – 2023-06-09 (×2): 10 mL

## 2023-06-08 MED ORDER — SODIUM CHLORIDE 0.9% FLUSH
10.0000 mL | Freq: Two times a day (BID) | INTRAVENOUS | Status: DC
  Administered 2023-06-08 – 2023-06-09 (×2): 10 mL

## 2023-06-08 NOTE — Progress Notes (Signed)
 PROGRESS NOTE    Tiffany Mcdaniel  GNF:[id number] DOB: [birth year] DOA: 06/07/2023 PCP: Leesa Pulling, MD   Brief Narrative:  Tiffany Mcdaniel is a 83 y.o. female with medical history significant of history of esophageal cancer, T12 compressive fracture, DM type II, generalized anxiety disorder, chronic inferior thoracic ulcer and bilateral upper extremities with superficial skin tears. She presents to our facility from [facility] for concern over infected thoracic vertebra. Neurosurgery (Dr Andy Bannister) was contacted and agreed to see the patient in consult.  MRI finding following: - T11-12 intervertebral disc is narrowed and has small volume fluid. Discitis at this level is not excluded. - T11 vertebral body interval anterior wedge compression deformity with greater than 75 percent loss of height. No definite signal abnormality suggest osteomyelitis.  Assessment & Plan:   Principal Problem:   Thoracic discitis Active Problems:   Discitis of thoracic region   History of esophageal cancer   Non-insulin  dependent type 2 diabetes mellitus (HCC)   Generalized anxiety disorder   Skin tear of elbow without complication   Hypercalcemia   Thoracic vertebral discitis Chronic ulcer of the thoracic vertebral region Chronic T12 vertebral body compression fracture T11 vertebral compression fracture and deformity - Patient has been transferred from [facility] to [facility] for neurosurgery evaluation. - Dr Andy Bannister following - appreciate insight and recs - will attempt to get MRI from [facility] - continue conservative management for now - ID following - plan for 6 weeks via PICC line (placed 4/30) - ESR, CRP, Lactic within normal limits  Ambulatory dysfunction Acute on chronic Mechanical fall 4/30 overnight Right olecranon fracture, not POA - Discussed case with orthopedic surgery, nonsurgical at this time - Recommend splint in the interim -Follow-up outpatient per their  schedule  Non-insulin -dependent DM type II -No recent A1c, previous 5.3 - Continue to follow for episodes of hyper or hypoglycemia, not currently on insulin  - No indication for point-of-care glucose check   Generalized anxiety disorder  -Continue Xanax as needed -consider decreasing or discontinuing this medication given patient's age and comorbid conditions especially in light of her recent fall   Depression -Follow-up outpatient with PCP, if necessary follow-up with psychiatry outpatient - Continue home alprazolam, fluoxetine   Skin tear of the bilateral upper extremity Thoracic vertebral T12 area ulcer -Consulted wound care   Hypercalcemia secondary to immobilization - Resolved with IV fluids, continue to increase p.o. intake as appropriate   History of esophageal adenocarcinoma 2008 -Patient follows gastroenterology and radiation oncology with [facility].  DVT prophylaxis: Lovenox Code Status:   Code Status: Full Code  Family Communication: Daughter updated over the phone  Status is: Inpatient  Dispo: The patient is from: Home              Anticipated d/c is to: Home              Anticipated d/c date is: 24 to 48 hours              Patient currently not medically stable for discharge  Consultants:  Neurosurgery, infectious disease  Procedures:  None planned  Antimicrobials:  Ceftriaxone , daptomycin, plan for 6-week total course via PICC line  Subjective: Overnight patient had noted mechanical fall, attempted to get out of bed on her own with questionable episode of delirium versus sundowning and states she tripped, subsequently noted right olecranon fracture.  Patient indicates pain is currently well-controlled otherwise denies nausea vomiting diarrhea constipation headache fever chills or chest pain.  Objective: Vitals:   06/07/23 1507 06/07/23 2045 06/08/23 0412 06/08/23 0748  BP: 128/68  134/85 (!) 147/84  Pulse: 88  77 89  Resp: 15     Temp: 98.1 F  (36.7 C)  97.8 F (36.6 C) (!) 97.5 F (36.4 C)  TempSrc: Oral  Oral Oral  SpO2: 97% 96% 94% 95%  Weight:      Height:        Intake/Output Summary (Last 24 hours) at 06/08/2023 0804 Last data filed at 06/08/2023 0400 Gross per 24 hour  Intake 827.5 ml  Output --  Net 827.5 ml   Filed Weights   06/07/23 0500  Weight: 41.6 kg    Examination:  General:  Pleasantly resting in bed, No acute distress. HEENT:  Normocephalic atraumatic.  Sclerae nonicteric, noninjected.  Extraocular movements intact bilaterally. Neck:  Without mass or deformity.  Trachea is midline. Lungs:  Clear to auscultate bilaterally without rhonchi, wheeze, or rales. Heart:  Regular rate and rhythm.  Without murmurs, rubs, or gallops. Abdomen:  Soft, nontender, nondistended.  Without guarding or rebound. Extremities: Left arm bandage clean dry intact, right arm range of motion strength testing not done due to recent fracture, otherwise 4 out of 5 strength Skin:  Warm and dry, no erythema.   Data Reviewed: I have personally reviewed following labs and imaging studies  CBC: Recent Labs  Lab 06/07/23 0550  WBC 8.3  HGB 12.6  HCT 36.9  MCV 94.1  PLT 243   Basic Metabolic Panel: Recent Labs  Lab 06/07/23 0550  NA 138  K 3.5  CL 102  CO2 27  GLUCOSE 67*  BUN 8  CREATININE 0.44  CALCIUM 8.9   GFR: Estimated Creatinine Clearance: 34.4 mL/min (by C-G formula based on SCr of 0.44 mg/dL). Liver Function Tests: Recent Labs  Lab 06/07/23 0550  AST 34  ALT 23  ALKPHOS 69  BILITOT 0.9  PROT 5.5*  ALBUMIN 2.5*   No results for input(s): "LIPASE", "AMYLASE" in the last 168 hours. No results for input(s): "AMMONIA" in the last 168 hours. Coagulation Profile: No results for input(s): "INR", "PROTIME" in the last 168 hours. Cardiac Enzymes: No results for input(s): "CKTOTAL", "CKMB", "CKMBINDEX", "TROPONINI" in the last 168 hours. BNP (last 3 results) No results for input(s): "PROBNP" in the  last 8760 hours. HbA1C: No results for input(s): "HGBA1C" in the last 72 hours. CBG: Recent Labs  Lab 06/07/23 0158  GLUCAP 102*   Lipid Profile: No results for input(s): "CHOL", "HDL", "LDLCALC", "TRIG", "CHOLHDL", "LDLDIRECT" in the last 72 hours. Thyroid  Function Tests: No results for input(s): "TSH", "T4TOTAL", "FREET4", "T3FREE", "THYROIDAB" in the last 72 hours. Anemia Panel: No results for input(s): "VITAMINB12", "FOLATE", "FERRITIN", "TIBC", "IRON", "RETICCTPCT" in the last 72 hours. Sepsis Labs: Recent Labs  Lab 06/07/23 0550 06/07/23 0832  LATICACIDVEN 0.8 1.7    Recent Results (from the past 240 hours)  Culture, blood (Routine X 2) w Reflex to ID Panel     Status: None (Preliminary result)   Collection Time: 06/07/23  5:50 AM   Specimen: BLOOD  Result Value Ref Range Status   Specimen Description BLOOD BLOOD LEFT HAND  Final   Special Requests   Final    BOTTLES DRAWN AEROBIC AND ANAEROBIC Blood Culture adequate volume   Culture   Final    NO GROWTH < 24 HOURS Performed at [facility], [address] [place], [place] [address]    Report Status PENDING  Incomplete  Culture, blood (Routine X 2) w Reflex to ID Panel     Status: None (Preliminary result)   Collection Time: 06/07/23  5:59 AM   Specimen: BLOOD  Result Value Ref Range Status   Specimen Description BLOOD BLOOD RIGHT HAND  Final   Special Requests   Final    BOTTLES DRAWN AEROBIC AND ANAEROBIC Blood Culture adequate volume   Culture   Final    NO GROWTH < 24 HOURS Performed at [facility], [address], [place], [place] [address]    Report Status PENDING  Incomplete         Radiology Studies: CT HEAD WO CONTRAST ( ) Result Date: 06/08/2023 CLINICAL DATA:  83 year old female status post fall, on blood thinners. Possible discitis. EXAM: CT HEAD WITHOUT CONTRAST TECHNIQUE: Contiguous axial images were obtained from the base of the skull through the vertex without intravenous  contrast. RADIATION DOSE REDUCTION: This exam was performed according to the departmental dose-optimization program which includes automated exposure control, adjustment of the mA and/or kV according to patient size and/or use of iterative reconstruction technique. COMPARISON:  None Available. FINDINGS: Brain: Cerebral volume is within normal limits for age. No midline shift, ventriculomegaly, mass effect, evidence of mass lesion, intracranial hemorrhage or evidence of cortically based acute infarction. Patchy and confluent bilateral cerebral white matter hypodensity in both hemispheres, circumscribed heterogeneity in the right basal ganglia most resembling chronic lacunar infarcts. Other deep gray nuclei relatively spared. No cortical encephalomalacia identified. Vascular: Calcified atherosclerosis at the skull base. No suspicious intracranial vascular hyperdensity. Skull: Intact.  No fracture identified. Sinuses/Orbits: Visualized paranasal sinuses and mastoids are clear. Other: No discrete orbit or scalp soft tissue injury identified. Postoperative changes to the globes. IMPRESSION: 1. No acute intracranial abnormality or acute traumatic injury identified. 2. Moderate for age cerebral white matter disease and chronic appearing lacunar infarcts in the right basal ganglia. Electronically Signed   By: Marlise Simpers M.D.   On: 06/08/2023 06:52        Scheduled Meds:  ALPRAZolam  0.5 mg Oral QID   atorvastatin  10 mg Oral Daily   budeson-glycopyrrolate-formoterol  2 puff Inhalation BID   enoxaparin (LOVENOX) injection  20 mg Subcutaneous Q24H   FLUoxetine  20 mg Oral Daily   liver oil-zinc oxide  1 Application Topical BID   nicotine  21 mg Transdermal Daily   pantoprazole  40 mg Oral Daily   sodium chloride  flush  3 mL Intravenous Q12H   Continuous Infusions:  cefTRIAXone  (ROCEPHIN )  IV 2 g (06/07/23 1451)   DAPTOmycin 350 mg (06/07/23 1615)     LOS: 1 day   Time spent:  Haydee Lipa,  DO [facility]  If 7PM-7AM, please contact night-coverage [email or web address]  06/08/2023, 8:04 AM

## 2023-06-08 NOTE — Progress Notes (Signed)
 PHARMACY CONSULT NOTE FOR:  OUTPATIENT  PARENTERAL ANTIBIOTIC THERAPY (OPAT)  Indication: Thoracic discitis/OM Regimen: Daptomycin 350 mg IV every 24 hours and Rocephin  2g IV every 24 hours End date: 07/19/23 (6 weeks using 4/29 as D1)  IV antibiotic discharge orders are pended. To discharging provider:  please sign these orders via discharge navigator,  Select New Orders & click on the button choice - Manage This Unsigned Work.     Thank you for allowing pharmacy to be a part of this patient's care.  Garland Junk, PharmD, BCPS, BCIDP Infectious Diseases Clinical Pharmacist 06/08/2023 1:15 PM   **Pharmacist phone directory can now be found on [email or web address] ([email or web address]).  Listed under [facility].

## 2023-06-08 NOTE — Significant Event (Signed)
 Fall note:   Notified by cross covering NP of unwitnessed fall. Patient reports getting up to go to bathroom, forgot where she was. On my exam, there is an abrasion over the occiput with scant bleeding, C spine is nontender. Extremities have multiple old skin tears per RN, there was an area of the L elbow that has a laceration appears to be deep through dermis about 1-2 cm but is hemostatic so presumed old as well, No active bleeding sites. Dressing over T spine intact, clean and dry. Extremities with R elbow with an effusion and tenderness / painful ROM. Other extremities range without pain and are nontender. She is cachectic, frail, disoriented.   Plan:  - CT Head  - XR R elbow  - Continued wound care with routine dressing per orders.   Arnulfo Larch, MD  [facility]

## 2023-06-08 NOTE — Progress Notes (Signed)
       Overnight   NAME: CAIDENCE DEAS MRN: [id number] DOB : 23-Dec-1940   RN notification to remote coverage  Date of Service   06/08/2023   HPI/Events of Note    Notified by RN of unwitnessed fall, patient is on anticoagulation.  Reported some injuries with bleeding. Sent notification to over watch Physician on site.  Please see Overwatch physician note 06/08/2023 0451 hrs. for specifics.  Prior notations of injuries Specifically - Neurosurgery-06/07/2023 1108 hrs. DM 2, history of esophageal cancer, history of T12 compression fracture status post kyphoplasty, large ulcer on the mid back. Reported "MRI at [facility] chronic T12 compression fracture, T11 compression fracture, disc narrowing T11-12 with some fluid." And  Please see note 06/07/2023 0725 hrs. for Attending Physician photograph.   Interventions/ Plan   CT head/ordered-pending Portable x-ray right elbow/ordered-pending Additional fall precautions per unit protocol.      Denece Finger BSN MSNA MSN ACNPC-AG Acute Care Nurse Practitioner [facility]

## 2023-06-08 NOTE — Progress Notes (Addendum)
 Pharmacy Antibiotic Note  Tiffany Mcdaniel is a 83 y.o. female admitted on 06/07/2023 with thoracic discitis . Pharmacy has been consulted for Cubicin and Rocephin  dosing.  Renal function stable and CK WNL.  Noted patient is on Lipitor.  Plan: Continue daptomycin 350 mg (8 mg/kg) IV q24h  Rocephin  2g IV q24h Monitor renal fxn, weekly CK  Height: 5\' 6"  (167.6 cm) Weight: 41.6 kg (91 lb 11.4 oz) (Wt 4/28 at [facility]) IBW/kg (Calculated) : 59.3  Temp (24hrs), Avg:97.9 F (36.6 C), Min:97.5 F (36.4 C), Max:98.2 F (36.8 C)  Recent Labs  Lab 06/07/23 0550 06/07/23 0832 06/08/23 0610  WBC 8.3  --  9.4  CREATININE 0.44  --  0.56  LATICACIDVEN 0.8 1.7  --     Estimated Creatinine Clearance: 34.4 mL/min (by C-G formula based on SCr of 0.56 mg/dL).    No Known Allergies   Vanc 1500mg /CRO 2g IV x1 at [employer] x1 4/29  Zosyn x1 4/29  Cubicin 4/29 >> Rocephin  4/29 >>  4/30 CK = 109  4/29 BCx -   Tiffany Mcdaniel D. Marikay Show, PharmD, BCPS, BCCCP 06/08/2023, 1:11 PM

## 2023-06-08 NOTE — Plan of Care (Signed)

## 2023-06-08 NOTE — Progress Notes (Signed)
 Peripherally Inserted Central Catheter Placement  The IV Nurse has discussed with the patient and/or persons authorized to consent for the patient, the purpose of this procedure and the potential benefits and risks involved with this procedure.  The benefits include less needle sticks, lab draws from the catheter, and the patient may be discharged home with the catheter. Risks include, but not limited to, infection, bleeding, blood clot (thrombus formation), and puncture of an artery; nerve damage and irregular heartbeat and possibility to perform a PICC exchange if needed/ordered by physician.  Alternatives to this procedure were also discussed.  Bard Power PICC patient education guide, fact sheet on infection prevention and patient information card has been provided to patient /or left at bedside.    PICC Placement Documentation  PICC Single Lumen 06/08/23 Left Brachial 39 cm 0 cm (Active)  Indication for Insertion or Continuance of Line Prolonged intravenous therapies 06/08/23 1524  Exposed Catheter (cm) 0 cm 06/08/23 1524  Site Assessment Clean, Dry, Intact 06/08/23 1524  Line Status Flushed;Blood return noted;Saline locked 06/08/23 1524  Dressing Type Transparent 06/08/23 1524  Dressing Status Antimicrobial disc/dressing in place 06/08/23 1524  Line Care Connections checked and tightened 06/08/23 1524  Line Adjustment (NICU/IV Team Only) No 06/08/23 1524  Dressing Intervention New dressing 06/08/23 1524  Dressing Change Due 06/15/23 06/08/23 1524       Tiffany Mcdaniel 06/08/2023, 3:26 PM

## 2023-06-08 NOTE — Progress Notes (Signed)
 [facility]    Date of Admission:  06/07/2023   Total days of antibiotics 3   ID: Tiffany Mcdaniel is a 83 y.o. female with  thoracic OM resulting from pressure wound/malnutrition Principal Problem:   Thoracic discitis Active Problems:   Discitis of thoracic region   History of esophageal cancer   Non-insulin  dependent type 2 diabetes mellitus (HCC)   Generalized anxiety disorder   Skin tear of elbow without complication   Hypercalcemia    Subjective: Patient remains afebrile. She is comfortable in bed. She was out of bed/disoriented and fell to right arm where she sustained olecranon fracture/moderately displaced, currently wrapped. She also had NCHCT that did not show any hemorrhage -due to abrasion over the occiput.  Medications:   ALPRAZolam  0.5 mg Oral QID   atorvastatin  10 mg Oral Daily   budeson-glycopyrrolate-formoterol  2 puff Inhalation BID   enoxaparin (LOVENOX) injection  20 mg Subcutaneous Q24H   FLUoxetine  20 mg Oral Daily   liver oil-zinc oxide  1 Application Topical BID   nicotine  21 mg Transdermal Daily   pantoprazole  40 mg Oral Daily   sodium chloride  flush  3 mL Intravenous Q12H    Objective: Vital signs in last 24 hours: Temp:  [97.5 F (36.4 C)-98.2 F (36.8 C)] 97.5 F (36.4 C) (04/30 0748) Pulse Rate:  [77-90] 89 (04/30 0748) Resp:  [18] 18 (04/29 2210) BP: (123-147)/(67-85) 147/84 (04/30 0748) SpO2:  [92 %-96 %] 95 % (04/30 0748)  Physical Exam  Constitutional:  oriented to person, place, and time. appears well-developed and well-nourished. No distress.  HENT: [place]/AT, PERRLA, no scleral icterus Mouth/Throat: Oropharynx is clear and moist. No oropharyngeal exudate.  Cardiovascular: Normal rate, regular rhythm and normal heart sounds. Exam reveals no gallop and no friction rub.  No murmur heard.  Pulmonary/Chest: Effort normal and breath sounds normal. No respiratory distress.  has no wheezes.  Ext: right arm  wrapped Abdominal: Soft. Bowel sounds are normal.  exhibits no distension. There is no tenderness.  Back = wound per media Neurological: alert and oriented to person, place, and time.  Skin: Skin is warm and dry. No rash noted. No erythema.  Psychiatric: a normal mood and affect.  behavior is normal.    Lab Results Recent Labs    06/07/23 0550 06/08/23 0610  WBC 8.3 9.4  HGB 12.6 13.2  HCT 36.9 39.5  NA 138 136  K 3.5 4.3  CL 102 98  CO2 27 29  BUN 8 9  CREATININE 0.44 0.56   Liver Panel Recent Labs    06/07/23 0550 06/08/23 0610  PROT 5.5* 5.6*  ALBUMIN 2.5* 2.5*  AST 34 43*  ALT 23 35  ALKPHOS 69 74  BILITOT 0.9 0.9   Sedimentation Rate Recent Labs    06/07/23 0550  ESRSEDRATE 6   C-Reactive Protein Recent Labs    06/07/23 0550  CRP 1.0*    Microbiology: ------------- Studies/Results: US  EKG SITE RITE Result Date: 06/08/2023 If Site Rite image not attached, placement could not be confirmed due to current cardiac rhythm.  DG Elbow 2 Views Right Result Date: 06/08/2023 CLINICAL DATA:  Right elbow pain after fall. EXAM: RIGHT ELBOW - 2 VIEW COMPARISON:  None Available. FINDINGS: Moderately displaced and comminuted fracture is seen involving the olecranon. Visualized portions of humerus and radius are unremarkable. IMPRESSION: Moderately displaced and comminuted olecranon fracture. Electronically Signed   By: Irving Mantle.D.  On: 06/08/2023 09:28   CT HEAD WO CONTRAST ( ) Result Date: 06/08/2023 CLINICAL DATA:  83 year old female status post fall, on blood thinners. Possible discitis. EXAM: CT HEAD WITHOUT CONTRAST TECHNIQUE: Contiguous axial images were obtained from the base of the skull through the vertex without intravenous contrast. RADIATION DOSE REDUCTION: This exam was performed according to the departmental dose-optimization program which includes automated exposure control, adjustment of the mA and/or kV according to patient size and/or use of  iterative reconstruction technique. COMPARISON:  None Available. FINDINGS: Brain: Cerebral volume is within normal limits for age. No midline shift, ventriculomegaly, mass effect, evidence of mass lesion, intracranial hemorrhage or evidence of cortically based acute infarction. Patchy and confluent bilateral cerebral white matter hypodensity in both hemispheres, circumscribed heterogeneity in the right basal ganglia most resembling chronic lacunar infarcts. Other deep gray nuclei relatively spared. No cortical encephalomalacia identified. Vascular: Calcified atherosclerosis at the skull base. No suspicious intracranial vascular hyperdensity. Skull: Intact.  No fracture identified. Sinuses/Orbits: Visualized paranasal sinuses and mastoids are clear. Other: No discrete orbit or scalp soft tissue injury identified. Postoperative changes to the globes. IMPRESSION: 1. No acute intracranial abnormality or acute traumatic injury identified. 2. Moderate for age cerebral white matter disease and chronic appearing lacunar infarcts in the right basal ganglia. Electronically Signed   By: Marlise Simpers M.D.   On: 06/08/2023 06:52     Assessment/Plan: Thoracic OM/deep tissue infection/decub ulcer from kyphosis = plan to treat as culture negative thoracic OM. Plan to treat for 6 wk. Thereafter, will decide if need to continue with abtx vs. Wound care. Plan for 6 wk of dapto and ceftriaxone  through 6/10  Will order picc line for the left arm since she has right elbow fracture Will arrange for home health to do abtx teaching  Severe protein -calorie malnutrition = increase nutritional intake and cut down smoking in order to promote wound healing  Right elbow fracture = recommend for ortho to evaluate to see if any surgery is needed  Diagnosis: Thoracic om  Culture Result: none  No Known Allergies  OPAT Orders Discharge antibiotics to be given via PICC line Discharge antibiotics: Per pharmacy protocol  daptomycin at  8mg /kg  daily x 6 wk plus ceftriaxone  2gm Iv daily  Duration: 6 wk End Date: 07/19/23  [facility] Care Per Protocol:  Home health RN for IV administration and teaching; PICC line care and labs.    Labs weekly while on IV antibiotics: _x_ CBC with differential _x_ BMP  x__ CRP __x ESR _X__ ck  __X Please pull PIC at completion of IV antibiotics  Fax weekly labs to [phone]  Clinic Follow Up Appt: At end of may or early June at [facility] Pager: [phone]  06/08/2023, 3:10 PM

## 2023-06-08 NOTE — Progress Notes (Signed)
   06/08/23 0412  What Happened  Was fall witnessed? No  Was patient injured? No (Bleeding from privious wounds)  Patient found on floor  Found by Staff-comment  Stated prior activity ambulating-unassisted  Provider Notification  Provider Name/Title Royanne Core  Date Provider Notified 06/08/23  Time Provider Notified 0410  Method of Notification Page  Notification Reason Fall  Provider response Other (Comment) (To send provider to see pt)  Date of Provider Response 06/08/23  Time of Provider Response [phone]  Follow Up  Family notified No - patient refusal  Adult Fall Risk Assessment  Risk Factor Category (scoring not indicated) Fall has occurred during this admission (document High fall risk)  Age 83  Fall History: Fall within 6 months prior to admission 0  Elimination; Bowel and/or Urine Incontinence 0  Elimination; Bowel and/or Urine Urgency/Frequency 0  Medications: includes PCA/Opiates, Anti-convulsants, Anti-hypertensives, Diuretics, Hypnotics, Laxatives, Sedatives, and Psychotropics 5  Patient Care Equipment 1  Mobility-Assistance 2  Mobility-Gait 2  Mobility-Sensory Deficit 2  Altered awareness of immediate physical environment 0  Impulsiveness 0  Lack of understanding of one's physical/cognitive limitations 0  Total Score 15  Patient Fall Risk Level High fall risk  Adult Fall Risk Interventions  Required Bundle Interventions *See Row Information* High fall risk - low, moderate, and high requirements implemented  Additional Interventions Reorient/diversional activities with confused patients;Use of appropriate toileting equipment (bedpan, BSC, etc.)  Fall intervention(s) refused/Patient educated regarding refusal Bed alarm;Nonskid socks  Screening for Fall Injury Risk (To be completed on HIGH fall risk patients) - Assessing Need for Floor Mats  Risk For Fall Injury- Criteria for Floor Mats None identified - No additional interventions needed  Vitals  Temp 97.8 F (36.6  C)  Temp Source Oral  BP 134/85  MAP (mmHg) 98  BP Location Right Arm  BP Method Automatic  Patient Position (if appropriate) Lying  Pulse Rate 77  Pulse Rate Source Monitor  ECG Heart Rate 75  Oxygen Therapy  SpO2 94 %  O2 Device Room Air  Pain Assessment  Pain Scale 0-10  Pain Score 0  PCA/Epidural/Spinal Assessment  Respiratory Pattern Regular  Neurological  Neuro (WDL) WDL  Level of Consciousness Alert  Orientation Level Oriented X4  Cognition Follows commands  Speech Clear  R Pupil Size (mm) 3  R Pupil Shape Round  R Pupil Reaction Brisk  L Pupil Size (mm) 3  L Pupil Shape Round  L Pupil Reaction Brisk  Motor Function/Sensation Assessment Grip  R Hand Grip Moderate  L Hand Grip Moderate  Neuro Symptoms Forgetful  Musculoskeletal  Musculoskeletal (WDL) X (Weakness)  Integumentary  Integumentary (WDL) X  Skin Color Appropriate for ethnicity  Skin Condition Other (Comment) (wounds, bleeding)  Skin Integrity Other (Comment) (Wounds, skin tears)  Pain Assessment  Result of Injury No  Pain Assessment  Work-Related Injury No  Pain Screening  Clinical Progression Not changed

## 2023-06-08 NOTE — TOC Initial Note (Addendum)
 Transition of Care [facility]) - Initial/Assessment Note    Patient Details  Name: Tiffany Mcdaniel MRN: [id number] Date of Birth: 1940/10/30  [facility]) CM/SW Contact:    Alisa App, RN Phone Number: 06/08/2023, 3:32 PM  Clinical Narrative:     From home with spouse and 4 supportive daughters that live close by. PTA independent with ADL's. Owns Product manager. Per provider pt with thoracic OM/deep tissue infection/decub ulcer from kyphosis.  ID states pt will require 6 wk IV ABX therapy.Pt/daughter agreeable to home infusion services. Referral made with Pam/[facility] and accepted. Angie / [facility] accepted pt for [facility] services.PICC line in. Home Infusion teaching to be completed by Pam with [facility]. Referral for DME : RW and hospital bed made with [facility]. Referral emailed to KathyParnell, [email or web address],[phone]. Equipment to deliver to home prior to d/c. Pt without transportation issues or RX med concerns.  TOC following and will assist with toc needs.. Expected Discharge Plan: Home w Home Health Services Barriers to Discharge: Continued Medical Work up   Patient Goals and CMS Choice     Choice offered to / list presented to : Adult Children, Patient      Expected Discharge Plan and Services   Discharge Planning Services: CM Consult   Living arrangements for the past 2 months: Single Family Home                 DME Arranged: Hospital bed, Lightweight manual wheelchair with seat cushion DME Agency: Other - Comment [facility]) Date DME Agency Contacted: 06/08/23 Time DME Agency Contacted: 1517 Representative spoke with at DME Agency: Thersia Flax HH Arranged: RN HH Agency: [facility] Date [facility] Agency Contacted: 06/08/23 Time HH Agency Contacted: 1514 Representative spoke with at [facility] Agency: Angie  Prior Living Arrangements/Services Living arrangements for the past 2 months: Single Family  Home   Patient language and need for interpreter reviewed:: Yes Do you feel safe going back to the place where you live?: Yes      Need for Family Participation in Patient Care: Yes (Comment) Care giver support system in place?: Yes (comment)   Criminal Activity/Legal Involvement Pertinent to Current Situation/Hospitalization: No - Comment as needed  Activities of Daily Living   ADL Screening (condition at time of admission) Independently performs ADLs?: No Does the patient have a NEW difficulty with bathing/dressing/toileting/self-feeding that is expected to last >3 days?: No Does the patient have a NEW difficulty with getting in/out of bed, walking, or climbing stairs that is expected to last >3 days?: No Does the patient have a NEW difficulty with communication that is expected to last >3 days?: No Is the patient deaf or have difficulty hearing?: No Does the patient have difficulty seeing, even when wearing glasses/contacts?: No Does the patient have difficulty concentrating, remembering, or making decisions?: No  Permission Sought/Granted   Permission granted to share information with : Yes, Verbal Permission Granted  Share Information with NAME: Maximino Spanner  Daughter  [phone]           Emotional Assessment       Orientation: : Oriented to Self, Oriented to Place, Oriented to  Time, Oriented to Situation Alcohol / Substance Use: Not Applicable Psych Involvement: No (comment)  Admission diagnosis:  Thoracic discitis [M46.44] Patient Active Problem List   Diagnosis Date Noted   Discitis of thoracic region 06/07/2023   History of esophageal cancer 06/07/2023   Non-insulin  dependent type 2 diabetes mellitus (  HCC) 06/07/2023   Generalized anxiety disorder 06/07/2023   Thoracic discitis 06/07/2023   Skin tear of elbow without complication 06/07/2023   Hypercalcemia 06/07/2023   PCP:  Leesa Pulling, MD Pharmacy:   [facility] [phone] - [place], [place] - [address] [place] [address] Phone: [phone] Fax: [phone]     Social Drivers of Health (SDOH) Social History: SDOH Screenings   Food Insecurity: Patient Declined (06/07/2023)  Housing: Unknown (06/07/2023)  Transportation Needs: No Transportation Needs (06/07/2023)  Utilities: Not At Risk (06/07/2023)  Social Connections: Moderately Integrated (06/07/2023)  Tobacco Use: High Risk (06/07/2023)   SDOH Interventions:     Readmission Risk Interventions     No data to display

## 2023-06-08 NOTE — Progress Notes (Signed)
 Orthopedic Tech Progress Note Patient Details:  JANAEYA LYSON Apr 25, 1940 [id number]  Ortho Devices Type of Ortho Device: Ace wrap, Cotton web roll, Long arm splint Ortho Device/Splint Location: RUE Ortho Device/Splint Interventions: Ordered, Application, Adjustment   Post Interventions Patient Tolerated: Well Instructions Provided: Care of device  Mikhayla Phillis L Legacie Dillingham 06/08/2023, 12:04 PM

## 2023-06-09 ENCOUNTER — Other Ambulatory Visit (HOSPITAL_COMMUNITY): Payer: Self-pay

## 2023-06-09 DIAGNOSIS — M4644 Discitis, unspecified, thoracic region: Secondary | ICD-10-CM | POA: Diagnosis not present

## 2023-06-09 MED ORDER — CEFTRIAXONE IV (FOR PTA / DISCHARGE USE ONLY): 2.0000 g | INTRAVENOUS | 0 refills | Status: AC

## 2023-06-09 MED ORDER — POLYETHYLENE GLYCOL 3350 17 G PO PACK: 17.0000 g | PACK | Freq: Two times a day (BID) | ORAL | Status: DC

## 2023-06-09 MED ORDER — SENNOSIDES-DOCUSATE SODIUM 8.6-50 MG PO TABS
1.0000 | ORAL_TABLET | Freq: Two times a day (BID) | ORAL | 0 refills | Status: AC
  Filled 2023-06-09 (×2): qty 60, 30d supply, fill #0

## 2023-06-09 MED ORDER — ATORVASTATIN CALCIUM 10 MG PO TABS: 10.0000 mg | ORAL_TABLET | Freq: Every morning | ORAL | 0 refills | Status: AC

## 2023-06-09 MED ORDER — POLYETHYLENE GLYCOL 3350 17 GM/SCOOP PO POWD
17.0000 g | Freq: Two times a day (BID) | ORAL | 0 refills | Status: AC
  Filled 2023-06-09: qty 14, 7d supply, fill #0
  Filled 2023-06-09: qty 238, 7d supply, fill #0

## 2023-06-09 MED ORDER — SENNOSIDES-DOCUSATE SODIUM 8.6-50 MG PO TABS: 1.0000 | ORAL_TABLET | Freq: Two times a day (BID) | ORAL | Status: DC

## 2023-06-09 MED ORDER — ALPRAZOLAM 0.5 MG PO TABS: 0.5000 mg | ORAL_TABLET | Freq: Two times a day (BID) | ORAL | 0 refills | Status: AC | PRN

## 2023-06-09 MED ORDER — DAPTOMYCIN IV (FOR PTA / DISCHARGE USE ONLY): 350.0000 mg | INTRAVENOUS | 0 refills | Status: AC

## 2023-06-09 NOTE — Discharge Summary (Signed)
 Physician Discharge Summary  Tiffany Mcdaniel JXB:[id number] DOB: 1940-07-30 DOA: 06/07/2023  PCP: Leesa Pulling, MD  Admit date: 06/07/2023 Discharge date: 06/09/2023  Admitted From: Home Disposition: Home  Recommendations for Outpatient Follow-up:  Follow up with PCP in 1-2 weeks Follow-up with neuro surgery as scheduled in 2 weeks  Home Health: RN Equipment/Devices: Planned infusions as below, hospital bed, wheelchair  Discharge Condition: Stable CODE STATUS: Full Diet recommendation: Low-salt low-fat diet  Brief/Interim Summary: Tiffany Mcdaniel is a 83 y.o. female with medical history significant of history of esophageal cancer, T12 compressive fracture, DM type II, generalized anxiety disorder, chronic inferior thoracic ulcer and bilateral upper extremities with superficial skin tears. She presents to our facility from [facility] for concern over infected thoracic vertebra. Neurosurgery (Dr Andy Bannister) was contacted and agreed to see the patient in consult.  MRI confirms T11 and 12 intervertebral disc narrowing with small volume fluid concerning for discitis, T11 also shows anterior wedge compression deformity without significant signs of osteomyelitis.  Neurosurgery following along recommending no intervention or procedure at this time, ID consulted for antibiotic recommendations currently recommending 6 weeks of daptomycin  and ceftriaxone  via PICC line.  Unfortunately during hospitalization patient had an episode of confusion overnight, likely sundowning versus delirium with mechanical fall and right olecranon fracture.  Orthopedic surgery consulted recommending outpatient follow-up and brace at this time she is otherwise stable and agreeable for discharge home, discussed with family at bedside, once patient has DME delivered to home she is otherwise stable for discharge.  Discharge Diagnoses:  Principal Problem:   Thoracic discitis Active Problems:   Discitis of thoracic region    History of esophageal cancer   Non-insulin  dependent type 2 diabetes mellitus (HCC)   Generalized anxiety disorder   Skin tear of elbow without complication   Hypercalcemia  Thoracic vertebral discitis Chronic ulcer of the thoracic vertebral region Chronic T12 vertebral body compression fracture T11 vertebral compression fracture and deformity - Patient has been transferred from [facility] to [facility] for neurosurgery evaluation. - Dr Andy Bannister following - appreciate insight and recs - will attempt to get MRI from [facility] - continue conservative management for now - ID following - plan for 6 weeks via PICC line (placed 4/30) - ESR, CRP, Lactic within normal limits -Follow-up with   Ambulatory dysfunction Acute on chronic Mechanical fall 4/30 overnight Right olecranon fracture, not POA - Discussed case with orthopedic surgery, nonsurgical at this time - Ortho recommend splint in the interim - Follow-up outpatient with neurosurgery per their recommendations   Non-insulin -dependent DM type II, well-controlled -No recent A1c, previous 5.3 - Continue to follow for episodes of hyper or hypoglycemia, not currently on insulin  - No indication for point-of-care glucose check   Generalized anxiety disorder  Depression - Continue Xanax  as needed at lower dose if tolerated -strongly recommend decreasing or discontinuing this medication given patient's age and comorbid conditions especially in light of her recent fall  - Continue fluoxetine   Skin tear of the bilateral upper extremity Thoracic vertebral T12 area ulcer - Continue wound care as below Hypercalcemia secondary to immobilization - Resolved with IV fluids, continue to increase p.o. intake as appropriate History of esophageal adenocarcinoma 2008 - Patient follows gastroenterology and radiation oncology with [facility].  Discharge Instructions  Discharge Instructions     [facility] pharmacist to adjust dose for  Vancomycin , Aminoglycosides and other anti-infective therapies as requested by physician.   Complete by: As directed    [facility] to provide Cath Flo 2mg    Complete by: As directed    Administer for PICC line occlusion and as ordered by physician for other access device issues.   Anaphylaxis Kit: Provided to treat any anaphylactic reaction to the medication being provided to the patient if First Dose or when requested by physician   Complete by: As directed    Epinephrine  1mg /ml vial / amp: Administer 0.3mg  (0.61ml) subcutaneously once for moderate to severe anaphylaxis, nurse to call physician and pharmacy when reaction occurs and call 911 if needed for immediate care   Diphenhydramine 50mg /ml IV vial: Administer 25-50mg  IV/IM PRN for first dose reaction, rash, itching, mild reaction, nurse to call physician and pharmacy when reaction occurs   Sodium Chloride  0.9% NS 500ml IV: Administer if needed for hypovolemic blood pressure drop or as ordered by physician after call to physician with anaphylactic reaction   Call MD for:  difficulty breathing, headache or visual disturbances   Complete by: As directed    Call MD for:  extreme fatigue   Complete by: As directed    Call MD for:  hives   Complete by: As directed    Call MD for:  persistant dizziness or light-headedness   Complete by: As directed    Call MD for:  persistant nausea and vomiting   Complete by: As directed    Call MD for:  redness, tenderness, or signs of infection (pain, swelling, redness, odor or green/yellow discharge around incision site)   Complete by: As directed    Call MD for:  severe uncontrolled pain   Complete by: As directed    Call MD for:  temperature >100.4   Complete by: As directed    Change dressing on IV access line weekly and PRN   Complete by: As directed    Diet - low sodium heart healthy   Complete by: As directed    Discharge wound care:   Complete by: As directed    Cleanse both upper  arm skin tears with saline, apply Xeroform gauze ([place] [phone]) to wounds every other day, cover with Telfa nonstick pads then Kerlix roll gauze and tape.  SOAK OFF OLD XEROFORM WITH SALINE IF STUCK TO WOUND BED FOR ATRAUMATIC REMOVAL  Cleanse thoracic(back) wound with Vashe wound cleanser Timm Foot [phone]) do not rinse and allow to air dry. Using a Q tip applicator apply Vashe moistened Kerlix roll gauze to wound bed making sure to cover any depth and/or undermining.  Cover with dry gauze and silicone foam or ABD pad   Flush IV access with Sodium Chloride  0.9% and Heparin 10 units/ml or 100 units/ml   Complete by: As directed    Home infusion instructions - [facility]   Complete by: As directed    Instructions: Flush IV access with Sodium Chloride  0.9% and Heparin 10units/ml or 100units/ml   Change dressing on IV access line: Weekly and PRN   Instructions Cath Flo 2mg : Administer for PICC Line occlusion and as ordered by physician for other access device   [facility] pharmacist to adjust dose for: Vancomycin , Aminoglycosides and other anti-infective therapies as requested by physician   Increase activity slowly   Complete by: As directed    Method of administration may be changed at the discretion of home infusion pharmacist based upon assessment of the patient and/or caregiver's ability to self-administer the medication ordered   Complete by: As directed       Allergies as of 06/09/2023  No Known Allergies      Medication List     STOP taking these medications    doxycycline 100 MG capsule Commonly known as: VIBRAMYCIN       TAKE these medications    acetaminophen  500 MG tablet Commonly known as: TYLENOL  Take 500 mg by mouth every 6 (six) hours as needed (pain.).   ALPRAZolam  0.5 MG tablet Commonly known as: XANAX  Take 1 tablet (0.5 mg total) by mouth 2 (two) times daily as needed for anxiety. Wean off this medication as discussed (Per BEERS  criteria) What changed:  when to take this additional instructions   atorvastatin  10 MG tablet Commonly known as: LIPITOR Take 1 tablet (10 mg total) by mouth in the morning. Do not take this medication while on Daptomycin  (antibiotic) Start taking on: July 25, 2023 What changed:  additional instructions These instructions start on July 25, 2023. If you are unsure what to do until then, ask your doctor or other care provider.   Breztri  Aerosphere 160-9-4.8 MCG/ACT Aero inhaler Generic drug: budeson-glycopyrrolate -formoterol  Inhale into the lungs.   cefTRIAXone  IVPB Commonly known as: ROCEPHIN  Inject 2 g into the vein daily. Indication:  Thoracic discitis/OM First Dose: Yes Last Day of Therapy:  07/19/23 Labs - Once weekly:  CBC/D and BMP, Labs - Once weekly: ESR and CRP Method of administration: IV Push Method of administration may be changed at the discretion of home infusion pharmacist based upon assessment of the patient and/or caregiver's ability to self-administer the medication ordered.   daptomycin  IVPB Commonly known as: CUBICIN  Inject 350 mg into the vein daily. Indication:  Thoracic discitis/OM First Dose: Yes Last Day of Therapy:  07/19/23 Labs - Once weekly:  CBC/D, BMP, and CPK Labs - Once weekly: ESR and CRP Method of administration: IV Push Method of administration may be changed at the discretion of home infusion pharmacist based upon assessment of the patient and/or caregiver's ability to self-administer the medication ordered.   famotidine 20 MG tablet Commonly known as: PEPCID Take 1 tablet by mouth 2 (two) times daily.   FLUoxetine  20 MG capsule Commonly known as: PROZAC  Take 20 mg by mouth daily.   ICAPS AREDS 2 PO Take 1 capsule by mouth daily.   omeprazole 40 MG capsule Commonly known as: PRILOSEC Take 40 mg by mouth in the morning.   polyethylene glycol powder 17 GM/SCOOP powder Commonly known as: GLYCOLAX /MIRALAX  Take 17 g by mouth 2 (two)  times daily.   Senna-S 8.6-50 MG tablet Generic drug: senna-docusate Take 1 tablet by mouth 2 (two) times daily.   Vitamin D3 125 MCG (5000 UT) Tabs Take 5,000 Units by mouth in the morning.               Durable Medical Equipment  (From admission, onward)           Start     Ordered   06/08/23 1454  For home use only DME Hospital bed  Once       Question Answer Comment  Length of Need 12 Months   Patient has (list medical condition): esophageal cancer, T12 compressive fracture, DM type II, generalized anxiety disorder, chronic inferior thoracic ulcer and bilateral upper extremities superficial skin tea   The above medical condition requires: Patient requires the ability to reposition frequently   Head must be elevated greater than: 30 degrees   Bed type Semi-electric   Support Surface: Gel Overlay      06/08/23 1506   06/08/23 1452  For home use only DME lightweight manual wheelchair with seat cushion  Once       Comments: Patient suffers from  esophageal cancer, T12 compressive fracture which impairs their ability to perform daily activities like bathing in the home.  A walker will not resolve  issue with performing activities of daily living. A wheelchair will allow patient to safely perform daily activities. Patient is not able to propel themselves in the home using a standard weight wheelchair due to general weakness. Patient can self propel in the lightweight wheelchair. Length of need 12 months . Accessories: elevating leg rests (ELRs), wheel locks, extensions and anti-tippers.   06/08/23 1453              Discharge Care Instructions  (From admission, onward)           Start     Ordered   06/09/23 0000  Change dressing on IV access line weekly and PRN  (Home infusion instructions - [facility] )        06/09/23 0930   06/09/23 0000  Discharge wound care:       Comments: Cleanse both upper arm skin tears with saline, apply Xeroform gauze  Timm Foot [phone]) to wounds every other day, cover with Telfa nonstick pads then Kerlix roll gauze and tape.  SOAK OFF OLD XEROFORM WITH SALINE IF STUCK TO WOUND BED FOR ATRAUMATIC REMOVAL  Cleanse thoracic(back) wound with Vashe wound cleanser Timm Foot [phone]) do not rinse and allow to air dry. Using a Q tip applicator apply Vashe moistened Kerlix roll gauze to wound bed making sure to cover any depth and/or undermining.  Cover with dry gauze and silicone foam or ABD pad   06/09/23 0930            Follow-up Information     Leesa Pulling, MD Follow up.   Specialty: Family Medicine Contact information: [address] [place] [address] [phone]         [facility] Follow up.   Why: IV ANTIBIOTIC THERAPY WILL BE PROVIDED BY [facility]. Questions:[phone]        [facility] Follow up.   Contact information: [facility] [address]  [place]  [place], [place] [address]  [phone]        [facility] Follow up.   Specialty: Assisted Living Facility Why: [facility] health RN,[facility]) Contact information: [address] [place] [address] [phone]                No Known Allergies  Consultations: Neurosurgery, ID, wound care  Procedures/Studies: CT ELBOW RIGHT WO CONTRAST Result Date: 06/08/2023 CLINICAL DATA:  Elbow trauma. EXAM: CT OF THE UPPER RIGHT EXTREMITY WITHOUT CONTRAST TECHNIQUE: Multidetector CT imaging of the upper right extremity was performed according to the standard protocol. RADIATION DOSE REDUCTION: This exam was performed according to the departmental dose-optimization program which includes automated exposure control, adjustment of the mA and/or kV according to patient size and/or use of iterative reconstruction technique. COMPARISON:  Right elbow radiographs 06/08/2023 FINDINGS:  Bones/Joint/Cartilage There is an acute, markedly comminuted fracture of the olecranon involving an approximate 3.2 cm length of the bone (sagittal series 6, images 52 through 62, axial series 8 images 26 through 42). These fracture lines extend from the medial to the lateral olecranon cortex and extend through the olecranon articular surface in multiple locations  within the lateral aspect (sagittal series 6, image 54) and a single dominant location within the medial aspect of the olecranon (sagittal series 6, image 62). At the medial aspect of the olecranon there is up to 11 mm craniocaudal diastasis (sagittal series 6, image 62). At the lateral aspect there are two dominant fracture lines without significant diastasis that surround and separate an approximate 2.6 cm ossicle at the lateral aspect of the olecranon (sagittal series 6 image 52 through 55). No acute fracture is seen within the distal humerus or proximal radius. Mild-to-moderate degenerative spurring of the tip of the coronoid process and the posterior articular border at the olecranon process. Ligaments Suboptimally assessed by CT. Muscles and Tendons No significant muscle abnormality is seen. Soft tissues Within the volar aspect of the upper arm at the level of the distal humeral metadiaphysis there is a skin wound/soft tissue defect measuring up to 2.8 cm in transverse dimension, 0.8 cm in depth, and 2.1 cm in craniocaudal length (axial series 2, image 54 and sagittal series 7, image 45). Mild hemarthrosis. IMPRESSION: 1. Acute, markedly comminuted fracture of the olecranon involving an approximate 3.2 cm length of the bone. At the medial aspect of the olecranon there is up to 11 mm craniocaudal diastasis of the intra-articular fracture line. At the lateral aspect there are two dominant fracture lines without significant diastasis that surround and separate an approximate 2.6 cm ossicle. 2. Within the volar aspect of the upper arm at the level of the  distal humeral metadiaphysis there is a skin wound/soft tissue defect measuring up to 2.8 cm in transverse dimension, 0.8 cm in depth, and 2.1 cm in craniocaudal length. Electronically Signed   By: Bertina Broccoli M.D.   On: 06/08/2023 21:45   US  EKG SITE RITE Result Date: 06/08/2023 If Site Rite image not attached, placement could not be confirmed due to current cardiac rhythm.  DG Elbow 2 Views Right Result Date: 06/08/2023 CLINICAL DATA:  Right elbow pain after fall. EXAM: RIGHT ELBOW - 2 VIEW COMPARISON:  None Available. FINDINGS: Moderately displaced and comminuted fracture is seen involving the olecranon. Visualized portions of humerus and radius are unremarkable. IMPRESSION: Moderately displaced and comminuted olecranon fracture. Electronically Signed   By: Rosalene Colon M.D.   On: 06/08/2023 09:28   CT HEAD WO CONTRAST ( ) Result Date: 06/08/2023 CLINICAL DATA:  83 year old female status post fall, on blood thinners. Possible discitis. EXAM: CT HEAD WITHOUT CONTRAST TECHNIQUE: Contiguous axial images were obtained from the base of the skull through the vertex without intravenous contrast. RADIATION DOSE REDUCTION: This exam was performed according to the departmental dose-optimization program which includes automated exposure control, adjustment of the mA and/or kV according to patient size and/or use of iterative reconstruction technique. COMPARISON:  None Available. FINDINGS: Brain: Cerebral volume is within normal limits for age. No midline shift, ventriculomegaly, mass effect, evidence of mass lesion, intracranial hemorrhage or evidence of cortically based acute infarction. Patchy and confluent bilateral cerebral white matter hypodensity in both hemispheres, circumscribed heterogeneity in the right basal ganglia most resembling chronic lacunar infarcts. Other deep gray nuclei relatively spared. No cortical encephalomalacia identified. Vascular: Calcified atherosclerosis at the skull base. No  suspicious intracranial vascular hyperdensity. Skull: Intact.  No fracture identified. Sinuses/Orbits: Visualized paranasal sinuses and mastoids are clear. Other: No discrete orbit or scalp soft tissue injury identified. Postoperative changes to the globes. IMPRESSION: 1. No acute intracranial abnormality or acute traumatic injury identified. 2. Moderate for age cerebral white matter disease  and chronic appearing lacunar infarcts in the right basal ganglia. Electronically Signed   By: Marlise Simpers M.D.   On: 06/08/2023 06:52     Subjective: No acute issues or events overnight denies nausea vomiting diarrhea constipation headache fevers chills or chest pain.   Discharge Exam: Vitals:   06/09/23 0412 06/09/23 0821  BP: 136/86 (!) 140/98  Pulse: 97 89  Resp: 17 16  Temp: 98.1 F (36.7 C) 97.8 F (36.6 C)  SpO2: 94% 93%   Vitals:   06/08/23 2006 06/09/23 0040 06/09/23 0412 06/09/23 0821  BP: (!) 115/100 133/87 136/86 (!) 140/98  Pulse: 93 94 97 89  Resp: 16 20 17 16   Temp: 97.9 F (36.6 C) 98.2 F (36.8 C) 98.1 F (36.7 C) 97.8 F (36.6 C)  TempSrc: Oral Oral Oral Oral  SpO2: 94% 93% 94% 93%  Weight:      Height:        General: Pt is alert, awake, not in acute distress Cardiovascular: RRR, S1/S2 +, no rubs, no gallops Respiratory: CTA bilaterally, no wheezing, no rhonchi Abdominal: Soft, NT, ND, bowel sounds + Extremities: Bilateral upper extremity bandages clean dry intact    The results of significant diagnostics from this hospitalization (including imaging, microbiology, ancillary and laboratory) are listed below for reference.     Microbiology: Recent Results (from the past 240 hours)  Culture, blood (Routine X 2) w Reflex to ID Panel     Status: None (Preliminary result)   Collection Time: 06/07/23  5:50 AM   Specimen: BLOOD  Result Value Ref Range Status   Specimen Description BLOOD BLOOD LEFT HAND  Final   Special Requests   Final    BOTTLES DRAWN AEROBIC AND  ANAEROBIC Blood Culture adequate volume   Culture   Final    NO GROWTH 2 DAYS Performed at [facility], [address], [place], [place] [address]    Report Status PENDING  Incomplete  Culture, blood (Routine X 2) w Reflex to ID Panel     Status: None (Preliminary result)   Collection Time: 06/07/23  5:59 AM   Specimen: BLOOD  Result Value Ref Range Status   Specimen Description BLOOD BLOOD RIGHT HAND  Final   Special Requests   Final    BOTTLES DRAWN AEROBIC AND ANAEROBIC Blood Culture adequate volume   Culture   Final    NO GROWTH 2 DAYS Performed at [facility], [address], [place], [place] [address]    Report Status PENDING  Incomplete     Labs: BNP (last 3 results) No results for input(s): "BNP" in the last 8760 hours. Basic Metabolic Panel: Recent Labs  Lab 06/07/23 0550 06/08/23 0610  NA 138 136  K 3.5 4.3  CL 102 98  CO2 27 29  GLUCOSE 67* 89  BUN 8 9  CREATININE 0.44 0.56  CALCIUM  8.9 9.1   Liver Function Tests: Recent Labs  Lab 06/07/23 0550 06/08/23 0610  AST 34 43*  ALT 23 35  ALKPHOS 69 74  BILITOT 0.9 0.9  PROT 5.5* 5.6*  ALBUMIN 2.5* 2.5*   No results for input(s): "LIPASE", "AMYLASE" in the last 168 hours. No results for input(s): "AMMONIA" in the last 168 hours. CBC: Recent Labs  Lab 06/07/23 0550 06/08/23 0610  WBC 8.3 9.4  HGB 12.6 13.2  HCT 36.9 39.5  MCV 94.1 95.0  PLT 243 229   Cardiac Enzymes: Recent Labs  Lab 06/08/23 0610  CKTOTAL 109  BNP: Invalid input(s): "POCBNP" CBG: Recent Labs  Lab 06/07/23 0158  GLUCAP 102*   D-Dimer No results for input(s): "DDIMER" in the last 72 hours. Hgb A1c No results for input(s): "HGBA1C" in the last 72 hours. Lipid Profile No results for input(s): "CHOL", "HDL", "LDLCALC", "TRIG", "CHOLHDL", "LDLDIRECT" in the last 72 hours. Thyroid  function studies No results for input(s): "TSH", "T4TOTAL", "T3FREE", "THYROIDAB" in the last 72 hours.  Invalid input(s):  "FREET3" Anemia work up No results for input(s): "VITAMINB12", "FOLATE", "FERRITIN", "TIBC", "IRON", "RETICCTPCT" in the last 72 hours. Urinalysis No results found for: "COLORURINE", "APPEARANCEUR", "LABSPEC", "PHURINE", "GLUCOSEU", "HGBUR", "BILIRUBINUR", "KETONESUR", "PROTEINUR", "UROBILINOGEN", "NITRITE", "LEUKOCYTESUR" Sepsis Labs Recent Labs  Lab 06/07/23 0550 06/08/23 0610  WBC 8.3 9.4   Microbiology Recent Results (from the past 240 hours)  Culture, blood (Routine X 2) w Reflex to ID Panel     Status: None (Preliminary result)   Collection Time: 06/07/23  5:50 AM   Specimen: BLOOD  Result Value Ref Range Status   Specimen Description BLOOD BLOOD LEFT HAND  Final   Special Requests   Final    BOTTLES DRAWN AEROBIC AND ANAEROBIC Blood Culture adequate volume   Culture   Final    NO GROWTH 2 DAYS Performed at [facility], [address] [place], [place] [address]    Report Status PENDING  Incomplete  Culture, blood (Routine X 2) w Reflex to ID Panel     Status: None (Preliminary result)   Collection Time: 06/07/23  5:59 AM   Specimen: BLOOD  Result Value Ref Range Status   Specimen Description BLOOD BLOOD RIGHT HAND  Final   Special Requests   Final    BOTTLES DRAWN AEROBIC AND ANAEROBIC Blood Culture adequate volume   Culture   Final    NO GROWTH 2 DAYS Performed at [facility], [address], [place], [place] [address]    Report Status PENDING  Incomplete     Time coordinating discharge: Over 30 minutes  SIGNED:   Haydee Lipa, DO [facility] 06/09/2023, 12:27 PM Pager   If 7PM-7AM, please contact night-coverage [email or web address]

## 2023-06-09 NOTE — Progress Notes (Signed)
 Daughter, Tiffany Mcdaniel, given discharge instructions and verbalized understanding. Dressing to back changed with Tiffany Mcdaniel observing. Tiffany Mcdaniel verbalized understanding of dressing change and feels comfortable changing dressing at home if needed. Patient to receive Daptomycin  and Rocephin  prior to discharge.

## 2023-06-10 DIAGNOSIS — Z9181 History of falling: Secondary | ICD-10-CM | POA: Diagnosis not present

## 2023-06-10 DIAGNOSIS — S21201A Unspecified open wound of right back wall of thorax without penetration into thoracic cavity, initial encounter: Secondary | ICD-10-CM | POA: Diagnosis not present

## 2023-06-10 DIAGNOSIS — F32A Depression, unspecified: Secondary | ICD-10-CM | POA: Diagnosis not present

## 2023-06-10 DIAGNOSIS — E119 Type 2 diabetes mellitus without complications: Secondary | ICD-10-CM | POA: Diagnosis not present

## 2023-06-10 DIAGNOSIS — E1165 Type 2 diabetes mellitus with hyperglycemia: Secondary | ICD-10-CM | POA: Diagnosis not present

## 2023-06-10 DIAGNOSIS — C159 Malignant neoplasm of esophagus, unspecified: Secondary | ICD-10-CM | POA: Diagnosis not present

## 2023-06-10 DIAGNOSIS — S22080D Wedge compression fracture of T11-T12 vertebra, subsequent encounter for fracture with routine healing: Secondary | ICD-10-CM | POA: Diagnosis not present

## 2023-06-10 DIAGNOSIS — S41111D Laceration without foreign body of right upper arm, subsequent encounter: Secondary | ICD-10-CM | POA: Diagnosis not present

## 2023-06-10 DIAGNOSIS — F411 Generalized anxiety disorder: Secondary | ICD-10-CM | POA: Diagnosis not present

## 2023-06-10 DIAGNOSIS — Z556 Problems related to health literacy: Secondary | ICD-10-CM | POA: Diagnosis not present

## 2023-06-10 DIAGNOSIS — S6992XD Unspecified injury of left wrist, hand and finger(s), subsequent encounter: Secondary | ICD-10-CM | POA: Diagnosis not present

## 2023-06-10 DIAGNOSIS — M4644 Discitis, unspecified, thoracic region: Secondary | ICD-10-CM | POA: Diagnosis not present

## 2023-06-10 DIAGNOSIS — Z993 Dependence on wheelchair: Secondary | ICD-10-CM | POA: Diagnosis not present

## 2023-06-12 LAB — CULTURE, BLOOD (ROUTINE X 2)
Culture: NO GROWTH
Culture: NO GROWTH
Special Requests: ADEQUATE
Special Requests: ADEQUATE

## 2023-06-13 DIAGNOSIS — C159 Malignant neoplasm of esophagus, unspecified: Secondary | ICD-10-CM | POA: Diagnosis not present

## 2023-06-13 DIAGNOSIS — S41111D Laceration without foreign body of right upper arm, subsequent encounter: Secondary | ICD-10-CM | POA: Diagnosis not present

## 2023-06-13 DIAGNOSIS — S6992XD Unspecified injury of left wrist, hand and finger(s), subsequent encounter: Secondary | ICD-10-CM | POA: Diagnosis not present

## 2023-06-13 DIAGNOSIS — F411 Generalized anxiety disorder: Secondary | ICD-10-CM | POA: Diagnosis not present

## 2023-06-13 DIAGNOSIS — E1165 Type 2 diabetes mellitus with hyperglycemia: Secondary | ICD-10-CM | POA: Diagnosis not present

## 2023-06-13 DIAGNOSIS — S22080D Wedge compression fracture of T11-T12 vertebra, subsequent encounter for fracture with routine healing: Secondary | ICD-10-CM | POA: Diagnosis not present

## 2023-06-14 DIAGNOSIS — C159 Malignant neoplasm of esophagus, unspecified: Secondary | ICD-10-CM | POA: Diagnosis not present

## 2023-06-14 DIAGNOSIS — E1165 Type 2 diabetes mellitus with hyperglycemia: Secondary | ICD-10-CM | POA: Diagnosis not present

## 2023-06-14 DIAGNOSIS — S6992XD Unspecified injury of left wrist, hand and finger(s), subsequent encounter: Secondary | ICD-10-CM | POA: Diagnosis not present

## 2023-06-14 DIAGNOSIS — R531 Weakness: Secondary | ICD-10-CM | POA: Diagnosis not present

## 2023-06-14 DIAGNOSIS — S41111D Laceration without foreign body of right upper arm, subsequent encounter: Secondary | ICD-10-CM | POA: Diagnosis not present

## 2023-06-14 DIAGNOSIS — F411 Generalized anxiety disorder: Secondary | ICD-10-CM | POA: Diagnosis not present

## 2023-06-14 DIAGNOSIS — S22080D Wedge compression fracture of T11-T12 vertebra, subsequent encounter for fracture with routine healing: Secondary | ICD-10-CM | POA: Diagnosis not present

## 2023-06-16 DIAGNOSIS — S41111D Laceration without foreign body of right upper arm, subsequent encounter: Secondary | ICD-10-CM | POA: Diagnosis not present

## 2023-06-16 DIAGNOSIS — F411 Generalized anxiety disorder: Secondary | ICD-10-CM | POA: Diagnosis not present

## 2023-06-16 DIAGNOSIS — S22080D Wedge compression fracture of T11-T12 vertebra, subsequent encounter for fracture with routine healing: Secondary | ICD-10-CM | POA: Diagnosis not present

## 2023-06-16 DIAGNOSIS — C159 Malignant neoplasm of esophagus, unspecified: Secondary | ICD-10-CM | POA: Diagnosis not present

## 2023-06-16 DIAGNOSIS — S6992XD Unspecified injury of left wrist, hand and finger(s), subsequent encounter: Secondary | ICD-10-CM | POA: Diagnosis not present

## 2023-06-16 DIAGNOSIS — E1165 Type 2 diabetes mellitus with hyperglycemia: Secondary | ICD-10-CM | POA: Diagnosis not present

## 2023-06-17 DIAGNOSIS — F32A Depression, unspecified: Secondary | ICD-10-CM | POA: Diagnosis not present

## 2023-06-17 DIAGNOSIS — E1165 Type 2 diabetes mellitus with hyperglycemia: Secondary | ICD-10-CM | POA: Diagnosis not present

## 2023-06-17 DIAGNOSIS — S6992XD Unspecified injury of left wrist, hand and finger(s), subsequent encounter: Secondary | ICD-10-CM | POA: Diagnosis not present

## 2023-06-17 DIAGNOSIS — Z556 Problems related to health literacy: Secondary | ICD-10-CM | POA: Diagnosis not present

## 2023-06-17 DIAGNOSIS — S41111D Laceration without foreign body of right upper arm, subsequent encounter: Secondary | ICD-10-CM | POA: Diagnosis not present

## 2023-06-17 DIAGNOSIS — F411 Generalized anxiety disorder: Secondary | ICD-10-CM | POA: Diagnosis not present

## 2023-06-17 DIAGNOSIS — Z9181 History of falling: Secondary | ICD-10-CM | POA: Diagnosis not present

## 2023-06-17 DIAGNOSIS — M4644 Discitis, unspecified, thoracic region: Secondary | ICD-10-CM | POA: Diagnosis not present

## 2023-06-17 DIAGNOSIS — S22080D Wedge compression fracture of T11-T12 vertebra, subsequent encounter for fracture with routine healing: Secondary | ICD-10-CM | POA: Diagnosis not present

## 2023-06-17 DIAGNOSIS — Z993 Dependence on wheelchair: Secondary | ICD-10-CM | POA: Diagnosis not present

## 2023-06-17 DIAGNOSIS — C159 Malignant neoplasm of esophagus, unspecified: Secondary | ICD-10-CM | POA: Diagnosis not present

## 2023-06-17 DIAGNOSIS — E119 Type 2 diabetes mellitus without complications: Secondary | ICD-10-CM | POA: Diagnosis not present

## 2023-06-20 DIAGNOSIS — E1165 Type 2 diabetes mellitus with hyperglycemia: Secondary | ICD-10-CM | POA: Diagnosis not present

## 2023-06-20 DIAGNOSIS — S41111D Laceration without foreign body of right upper arm, subsequent encounter: Secondary | ICD-10-CM | POA: Diagnosis not present

## 2023-06-20 DIAGNOSIS — S6992XD Unspecified injury of left wrist, hand and finger(s), subsequent encounter: Secondary | ICD-10-CM | POA: Diagnosis not present

## 2023-06-20 DIAGNOSIS — S22080D Wedge compression fracture of T11-T12 vertebra, subsequent encounter for fracture with routine healing: Secondary | ICD-10-CM | POA: Diagnosis not present

## 2023-06-20 DIAGNOSIS — F411 Generalized anxiety disorder: Secondary | ICD-10-CM | POA: Diagnosis not present

## 2023-06-20 DIAGNOSIS — C159 Malignant neoplasm of esophagus, unspecified: Secondary | ICD-10-CM | POA: Diagnosis not present

## 2023-06-21 DIAGNOSIS — S22080D Wedge compression fracture of T11-T12 vertebra, subsequent encounter for fracture with routine healing: Secondary | ICD-10-CM | POA: Diagnosis not present

## 2023-06-21 DIAGNOSIS — E1165 Type 2 diabetes mellitus with hyperglycemia: Secondary | ICD-10-CM | POA: Diagnosis not present

## 2023-06-21 DIAGNOSIS — S6992XD Unspecified injury of left wrist, hand and finger(s), subsequent encounter: Secondary | ICD-10-CM | POA: Diagnosis not present

## 2023-06-21 DIAGNOSIS — S41111D Laceration without foreign body of right upper arm, subsequent encounter: Secondary | ICD-10-CM | POA: Diagnosis not present

## 2023-06-21 DIAGNOSIS — C159 Malignant neoplasm of esophagus, unspecified: Secondary | ICD-10-CM | POA: Diagnosis not present

## 2023-06-21 DIAGNOSIS — F411 Generalized anxiety disorder: Secondary | ICD-10-CM | POA: Diagnosis not present

## 2023-06-23 DIAGNOSIS — C159 Malignant neoplasm of esophagus, unspecified: Secondary | ICD-10-CM | POA: Diagnosis not present

## 2023-06-23 DIAGNOSIS — E1165 Type 2 diabetes mellitus with hyperglycemia: Secondary | ICD-10-CM | POA: Diagnosis not present

## 2023-06-23 DIAGNOSIS — S41111D Laceration without foreign body of right upper arm, subsequent encounter: Secondary | ICD-10-CM | POA: Diagnosis not present

## 2023-06-23 DIAGNOSIS — S6992XD Unspecified injury of left wrist, hand and finger(s), subsequent encounter: Secondary | ICD-10-CM | POA: Diagnosis not present

## 2023-06-23 DIAGNOSIS — F411 Generalized anxiety disorder: Secondary | ICD-10-CM | POA: Diagnosis not present

## 2023-06-23 DIAGNOSIS — S22080D Wedge compression fracture of T11-T12 vertebra, subsequent encounter for fracture with routine healing: Secondary | ICD-10-CM | POA: Diagnosis not present

## 2023-06-27 DIAGNOSIS — E1165 Type 2 diabetes mellitus with hyperglycemia: Secondary | ICD-10-CM | POA: Diagnosis not present

## 2023-06-27 DIAGNOSIS — F411 Generalized anxiety disorder: Secondary | ICD-10-CM | POA: Diagnosis not present

## 2023-06-27 DIAGNOSIS — S6992XD Unspecified injury of left wrist, hand and finger(s), subsequent encounter: Secondary | ICD-10-CM | POA: Diagnosis not present

## 2023-06-27 DIAGNOSIS — S41111D Laceration without foreign body of right upper arm, subsequent encounter: Secondary | ICD-10-CM | POA: Diagnosis not present

## 2023-06-27 DIAGNOSIS — S22080D Wedge compression fracture of T11-T12 vertebra, subsequent encounter for fracture with routine healing: Secondary | ICD-10-CM | POA: Diagnosis not present

## 2023-06-27 DIAGNOSIS — C159 Malignant neoplasm of esophagus, unspecified: Secondary | ICD-10-CM | POA: Diagnosis not present

## 2023-06-28 DIAGNOSIS — S41111D Laceration without foreign body of right upper arm, subsequent encounter: Secondary | ICD-10-CM | POA: Diagnosis not present

## 2023-06-28 DIAGNOSIS — C159 Malignant neoplasm of esophagus, unspecified: Secondary | ICD-10-CM | POA: Diagnosis not present

## 2023-06-28 DIAGNOSIS — S6992XD Unspecified injury of left wrist, hand and finger(s), subsequent encounter: Secondary | ICD-10-CM | POA: Diagnosis not present

## 2023-06-28 DIAGNOSIS — E1165 Type 2 diabetes mellitus with hyperglycemia: Secondary | ICD-10-CM | POA: Diagnosis not present

## 2023-06-28 DIAGNOSIS — M461 Sacroiliitis, not elsewhere classified: Secondary | ICD-10-CM | POA: Diagnosis not present

## 2023-06-28 DIAGNOSIS — S22080D Wedge compression fracture of T11-T12 vertebra, subsequent encounter for fracture with routine healing: Secondary | ICD-10-CM | POA: Diagnosis not present

## 2023-06-28 DIAGNOSIS — F411 Generalized anxiety disorder: Secondary | ICD-10-CM | POA: Diagnosis not present

## 2023-07-01 DIAGNOSIS — E1165 Type 2 diabetes mellitus with hyperglycemia: Secondary | ICD-10-CM | POA: Diagnosis not present

## 2023-07-01 DIAGNOSIS — S41111D Laceration without foreign body of right upper arm, subsequent encounter: Secondary | ICD-10-CM | POA: Diagnosis not present

## 2023-07-01 DIAGNOSIS — S22080D Wedge compression fracture of T11-T12 vertebra, subsequent encounter for fracture with routine healing: Secondary | ICD-10-CM | POA: Diagnosis not present

## 2023-07-01 DIAGNOSIS — S52021A Displaced fracture of olecranon process without intraarticular extension of right ulna, initial encounter for closed fracture: Secondary | ICD-10-CM | POA: Diagnosis not present

## 2023-07-01 DIAGNOSIS — F411 Generalized anxiety disorder: Secondary | ICD-10-CM | POA: Diagnosis not present

## 2023-07-01 DIAGNOSIS — C159 Malignant neoplasm of esophagus, unspecified: Secondary | ICD-10-CM | POA: Diagnosis not present

## 2023-07-01 DIAGNOSIS — S6992XD Unspecified injury of left wrist, hand and finger(s), subsequent encounter: Secondary | ICD-10-CM | POA: Diagnosis not present

## 2023-07-05 DIAGNOSIS — S41111D Laceration without foreign body of right upper arm, subsequent encounter: Secondary | ICD-10-CM | POA: Diagnosis not present

## 2023-07-05 DIAGNOSIS — S22080D Wedge compression fracture of T11-T12 vertebra, subsequent encounter for fracture with routine healing: Secondary | ICD-10-CM | POA: Diagnosis not present

## 2023-07-05 DIAGNOSIS — C159 Malignant neoplasm of esophagus, unspecified: Secondary | ICD-10-CM | POA: Diagnosis not present

## 2023-07-05 DIAGNOSIS — F411 Generalized anxiety disorder: Secondary | ICD-10-CM | POA: Diagnosis not present

## 2023-07-05 DIAGNOSIS — M4644 Discitis, unspecified, thoracic region: Secondary | ICD-10-CM | POA: Diagnosis not present

## 2023-07-05 DIAGNOSIS — E1165 Type 2 diabetes mellitus with hyperglycemia: Secondary | ICD-10-CM | POA: Diagnosis not present

## 2023-07-05 DIAGNOSIS — S6992XD Unspecified injury of left wrist, hand and finger(s), subsequent encounter: Secondary | ICD-10-CM | POA: Diagnosis not present

## 2023-07-06 ENCOUNTER — Telehealth: Payer: Self-pay

## 2023-07-06 DIAGNOSIS — S6992XD Unspecified injury of left wrist, hand and finger(s), subsequent encounter: Secondary | ICD-10-CM | POA: Diagnosis not present

## 2023-07-06 DIAGNOSIS — C159 Malignant neoplasm of esophagus, unspecified: Secondary | ICD-10-CM | POA: Diagnosis not present

## 2023-07-06 DIAGNOSIS — F411 Generalized anxiety disorder: Secondary | ICD-10-CM | POA: Diagnosis not present

## 2023-07-06 DIAGNOSIS — E1165 Type 2 diabetes mellitus with hyperglycemia: Secondary | ICD-10-CM | POA: Diagnosis not present

## 2023-07-06 DIAGNOSIS — S41111D Laceration without foreign body of right upper arm, subsequent encounter: Secondary | ICD-10-CM | POA: Diagnosis not present

## 2023-07-06 DIAGNOSIS — S22080D Wedge compression fracture of T11-T12 vertebra, subsequent encounter for fracture with routine healing: Secondary | ICD-10-CM | POA: Diagnosis not present

## 2023-07-06 NOTE — Telephone Encounter (Signed)
 Appointment will be virtual 07/07/2023. Daughter made aware.  Hendricks Locker, LPN

## 2023-07-07 ENCOUNTER — Telehealth (INDEPENDENT_AMBULATORY_CARE_PROVIDER_SITE_OTHER): Payer: Self-pay | Admitting: Internal Medicine

## 2023-07-07 ENCOUNTER — Encounter: Payer: Self-pay | Admitting: Internal Medicine

## 2023-07-07 ENCOUNTER — Telehealth: Payer: Self-pay

## 2023-07-07 ENCOUNTER — Other Ambulatory Visit: Payer: Self-pay

## 2023-07-07 DIAGNOSIS — E43 Unspecified severe protein-calorie malnutrition: Secondary | ICD-10-CM

## 2023-07-07 DIAGNOSIS — M4644 Discitis, unspecified, thoracic region: Secondary | ICD-10-CM

## 2023-07-07 DIAGNOSIS — L89136 Pressure-induced deep tissue damage of right lower back: Secondary | ICD-10-CM

## 2023-07-07 DIAGNOSIS — L89139 Pressure ulcer of right lower back, unspecified stage: Secondary | ICD-10-CM | POA: Diagnosis not present

## 2023-07-07 NOTE — Telephone Encounter (Signed)
  Per provider ok to PULL PICC after End Date.   Provider: Liane Redman MD End Date:07/19/23  Pls fax lab results to [facility]. Thanks    Notified Dentist and [facility] to contact Home Health Nurse.

## 2023-07-07 NOTE — Progress Notes (Signed)
 I connected with  Tiffany Mcdaniel on 07/09/23 by a video enabled telemedicine application and verified that I am speaking with the correct person using two identifiers.   I discussed the limitations of evaluation and management by telemedicine. The patient expressed understanding and agreed to proceed.  Patient is at home, and provider in clinic via video visit with patient, husband and her daughter  Patient ID: Tiffany Mcdaniel, female   DOB: 1940-09-10, 83 y.o.   MRN: [id number]  HPI 83yo F with hx of recent hospitalization for weakness, fall and Thoracic OM/deep tissue infection/decub ulcer from kyphosis, she was discharged with the plan to treat as culture negative thoracic OM with  Wound care and 6 wk of dapto and ceftriaxone  through 6/10. Her hospital course was complicated when she fell out of bed and fractured her right elbow. The more pressing issue is that she also has Severe protein -calorie malnutrition where she was instructed to  increase nutritional intake and cut down smoking in order to promote wound healing. Since being at home, she is tolerating her iv abtx however, has spent much of the time on her back, her family reports the tendon/spine not as visible but areas is larger. Tiffany Mcdaniel' daughter reports that she is not eating well.     Outpatient Encounter Medications as of 07/07/2023  Medication Sig   acetaminophen  (TYLENOL ) 500 MG tablet Take 500 mg by mouth every 6 (six) hours as needed (pain.).   ALPRAZolam  (XANAX ) 0.5 MG tablet Take 1 tablet (0.5 mg total) by mouth 2 (two) times daily as needed for anxiety. Wean off this medication as discussed (Per BEERS criteria)   [START ON 07/25/2023] atorvastatin  (LIPITOR) 10 MG tablet Take 1 tablet (10 mg total) by mouth in the morning. Do not take this medication while on Daptomycin  (antibiotic)   BREZTRI  AEROSPHERE 160-9-4.8 MCG/ACT AERO inhaler Inhale into the lungs.   cefTRIAXone  (ROCEPHIN ) IVPB Inject 2 g into the vein daily.  Indication:  Thoracic discitis/OM First Dose: Yes Last Day of Therapy:  07/19/23 Labs - Once weekly:  CBC/D and BMP, Labs - Once weekly: ESR and CRP Method of administration: IV Push Method of administration may be changed at the discretion of home infusion pharmacist based upon assessment of the patient and/or caregiver's ability to self-administer the medication ordered.   Cholecalciferol (VITAMIN D3) 125 MCG (5000 UT) TABS Take 5,000 Units by mouth in the morning.   daptomycin  (CUBICIN ) IVPB Inject 350 mg into the vein daily. Indication:  Thoracic discitis/OM First Dose: Yes Last Day of Therapy:  07/19/23 Labs - Once weekly:  CBC/D, BMP, and CPK Labs - Once weekly: ESR and CRP Method of administration: IV Push Method of administration may be changed at the discretion of home infusion pharmacist based upon assessment of the patient and/or caregiver's ability to self-administer the medication ordered.   doxycycline (VIBRAMYCIN) 100 MG capsule Take 1 capsule by mouth 2 (two) times daily.   famotidine (PEPCID) 20 MG tablet Take 1 tablet by mouth 2 (two) times daily.   FLUoxetine  (PROZAC ) 20 MG capsule Take 20 mg by mouth daily.   Multiple Vitamins-Minerals (ICAPS AREDS 2 PO) Take 1 capsule by mouth daily.   omeprazole (PRILOSEC) 40 MG capsule Take 40 mg by mouth in the morning.   polyethylene glycol powder (GLYCOLAX /MIRALAX ) 17 GM/SCOOP powder Take 17 g by mouth 2 (two) times daily.   senna-docusate (SENOKOT-S) 8.6-50 MG tablet Take 1 tablet by mouth 2 (two) times daily.   No  facility-administered encounter medications on file as of 07/07/2023.     Patient Active Problem List   Diagnosis Date Noted   Discitis of thoracic region 06/07/2023   History of esophageal cancer 06/07/2023   Non-insulin  dependent type 2 diabetes mellitus (HCC) 06/07/2023   Generalized anxiety disorder 06/07/2023   Thoracic discitis 06/07/2023   Skin tear of elbow without complication 06/07/2023   Hypercalcemia  06/07/2023     Health Maintenance Due  Topic Date Due   FOOT EXAM  Never done   OPHTHALMOLOGY EXAM  Never done   Diabetic kidney evaluation - Urine ACR  Never done   DTaP/Tdap/Td (1 - Tdap) Never done   Pneumonia Vaccine 35+ Years old (1 of 2 - PCV) Never done   Zoster Vaccines- Shingrix (1 of 2) Never done   DEXA SCAN  Never done   HEMOGLOBIN A1C  10/25/2021   Medicare Annual Wellness (AWV)  02/10/2022   COVID-19 Vaccine (5 - 2024-25 season) 10/10/2022     Review of Systems No fevers, no diarrhea. Not significant drainage from back Physical Exam  A x o by 3 in nad Still appears cachetic and chronically ill Skin exam:   CBC Lab Results  Component Value Date   WBC 9.4 06/08/2023   RBC 4.16 06/08/2023   HGB 13.2 06/08/2023   HCT 39.5 06/08/2023   PLT 229 06/08/2023   MCV 95.0 06/08/2023   MCH 31.7 06/08/2023   MCHC 33.4 06/08/2023   RDW 16.1 (H) 06/08/2023    BMET Lab Results  Component Value Date   NA 136 06/08/2023   K 4.3 06/08/2023   CL 98 06/08/2023   CO2 29 06/08/2023   GLUCOSE 89 06/08/2023   BUN 9 06/08/2023   CREATININE 0.56 06/08/2023   CALCIUM  9.1 06/08/2023   GFRNONAA >60 06/08/2023      Assessment and Plan Thoracic osteomyelitis and pressure wounds to spine = the affected area looks enlarged from when she was in the hospital, more skin breakdown. She has iv abtx through 6/10, recommend to pull picc line and will send in 2 additonal weeks of cefadroxil to complete 8 wk course of treatment.  At this point this is really about continued wound care management and better nutritional intake; will reach out to home health to see if can redo wound care consultation. I shared my dismay that this is getting worse and will need significant attention on wound care management. Will give referral to wound care but patient is also somewhat immobile difficulty with car transfers.   Gave instructions on how to  Increase nutritional bed  Air mattress  pending  Recommend they follow up with pcp since she has not seen them since discharge; hopefully palliative care discussion will be broached.

## 2023-07-07 NOTE — Patient Instructions (Signed)
 Smoking Cessation: QuitlineNC 1-800-QUIT-NOW 707-701-6721); Espaol: 1-855-Djelo-Ya (1-780-445-4976) [email or web address]

## 2023-07-08 ENCOUNTER — Encounter: Payer: Self-pay | Admitting: Internal Medicine

## 2023-07-09 DIAGNOSIS — M4644 Discitis, unspecified, thoracic region: Secondary | ICD-10-CM | POA: Diagnosis not present

## 2023-07-09 DIAGNOSIS — K2100 Gastro-esophageal reflux disease with esophagitis, without bleeding: Secondary | ICD-10-CM | POA: Diagnosis not present

## 2023-07-09 DIAGNOSIS — F17210 Nicotine dependence, cigarettes, uncomplicated: Secondary | ICD-10-CM | POA: Diagnosis not present

## 2023-07-09 DIAGNOSIS — F331 Major depressive disorder, recurrent, moderate: Secondary | ICD-10-CM | POA: Diagnosis not present

## 2023-07-09 MED ORDER — CEFADROXIL 500 MG PO CAPS
500.0000 mg | ORAL_CAPSULE | Freq: Two times a day (BID) | ORAL | 0 refills | Status: AC
Start: 2023-07-09 — End: ?

## 2023-07-10 DIAGNOSIS — F411 Generalized anxiety disorder: Secondary | ICD-10-CM | POA: Diagnosis not present

## 2023-07-10 DIAGNOSIS — Z993 Dependence on wheelchair: Secondary | ICD-10-CM | POA: Diagnosis not present

## 2023-07-10 DIAGNOSIS — Z556 Problems related to health literacy: Secondary | ICD-10-CM | POA: Diagnosis not present

## 2023-07-10 DIAGNOSIS — S41111D Laceration without foreign body of right upper arm, subsequent encounter: Secondary | ICD-10-CM | POA: Diagnosis not present

## 2023-07-10 DIAGNOSIS — M4644 Discitis, unspecified, thoracic region: Secondary | ICD-10-CM | POA: Diagnosis not present

## 2023-07-10 DIAGNOSIS — C159 Malignant neoplasm of esophagus, unspecified: Secondary | ICD-10-CM | POA: Diagnosis not present

## 2023-07-10 DIAGNOSIS — Z9181 History of falling: Secondary | ICD-10-CM | POA: Diagnosis not present

## 2023-07-10 DIAGNOSIS — S21201A Unspecified open wound of right back wall of thorax without penetration into thoracic cavity, initial encounter: Secondary | ICD-10-CM | POA: Diagnosis not present

## 2023-07-10 DIAGNOSIS — E119 Type 2 diabetes mellitus without complications: Secondary | ICD-10-CM | POA: Diagnosis not present

## 2023-07-10 DIAGNOSIS — S6992XD Unspecified injury of left wrist, hand and finger(s), subsequent encounter: Secondary | ICD-10-CM | POA: Diagnosis not present

## 2023-07-10 DIAGNOSIS — F32A Depression, unspecified: Secondary | ICD-10-CM | POA: Diagnosis not present

## 2023-07-10 DIAGNOSIS — E1165 Type 2 diabetes mellitus with hyperglycemia: Secondary | ICD-10-CM | POA: Diagnosis not present

## 2023-07-10 DIAGNOSIS — S22080D Wedge compression fracture of T11-T12 vertebra, subsequent encounter for fracture with routine healing: Secondary | ICD-10-CM | POA: Diagnosis not present

## 2023-07-12 DIAGNOSIS — E1165 Type 2 diabetes mellitus with hyperglycemia: Secondary | ICD-10-CM | POA: Diagnosis not present

## 2023-07-12 DIAGNOSIS — C159 Malignant neoplasm of esophagus, unspecified: Secondary | ICD-10-CM | POA: Diagnosis not present

## 2023-07-12 DIAGNOSIS — F411 Generalized anxiety disorder: Secondary | ICD-10-CM | POA: Diagnosis not present

## 2023-07-12 DIAGNOSIS — S6992XD Unspecified injury of left wrist, hand and finger(s), subsequent encounter: Secondary | ICD-10-CM | POA: Diagnosis not present

## 2023-07-12 DIAGNOSIS — S41111D Laceration without foreign body of right upper arm, subsequent encounter: Secondary | ICD-10-CM | POA: Diagnosis not present

## 2023-07-12 DIAGNOSIS — E119 Type 2 diabetes mellitus without complications: Secondary | ICD-10-CM | POA: Diagnosis not present

## 2023-07-12 DIAGNOSIS — S22080D Wedge compression fracture of T11-T12 vertebra, subsequent encounter for fracture with routine healing: Secondary | ICD-10-CM | POA: Diagnosis not present

## 2023-07-13 DIAGNOSIS — F411 Generalized anxiety disorder: Secondary | ICD-10-CM | POA: Diagnosis not present

## 2023-07-13 DIAGNOSIS — C159 Malignant neoplasm of esophagus, unspecified: Secondary | ICD-10-CM | POA: Diagnosis not present

## 2023-07-13 DIAGNOSIS — S22080D Wedge compression fracture of T11-T12 vertebra, subsequent encounter for fracture with routine healing: Secondary | ICD-10-CM | POA: Diagnosis not present

## 2023-07-13 DIAGNOSIS — S6992XD Unspecified injury of left wrist, hand and finger(s), subsequent encounter: Secondary | ICD-10-CM | POA: Diagnosis not present

## 2023-07-13 DIAGNOSIS — E1165 Type 2 diabetes mellitus with hyperglycemia: Secondary | ICD-10-CM | POA: Diagnosis not present

## 2023-07-13 DIAGNOSIS — S41111D Laceration without foreign body of right upper arm, subsequent encounter: Secondary | ICD-10-CM | POA: Diagnosis not present

## 2023-07-14 DIAGNOSIS — F331 Major depressive disorder, recurrent, moderate: Secondary | ICD-10-CM | POA: Diagnosis not present

## 2023-07-14 DIAGNOSIS — F17210 Nicotine dependence, cigarettes, uncomplicated: Secondary | ICD-10-CM | POA: Diagnosis not present

## 2023-07-14 DIAGNOSIS — M4644 Discitis, unspecified, thoracic region: Secondary | ICD-10-CM | POA: Diagnosis not present

## 2023-07-14 DIAGNOSIS — Z681 Body mass index (BMI) 19 or less, adult: Secondary | ICD-10-CM | POA: Diagnosis not present

## 2023-07-14 DIAGNOSIS — K2100 Gastro-esophageal reflux disease with esophagitis, without bleeding: Secondary | ICD-10-CM | POA: Diagnosis not present

## 2023-07-18 DIAGNOSIS — C159 Malignant neoplasm of esophagus, unspecified: Secondary | ICD-10-CM | POA: Diagnosis not present

## 2023-07-18 DIAGNOSIS — S41111D Laceration without foreign body of right upper arm, subsequent encounter: Secondary | ICD-10-CM | POA: Diagnosis not present

## 2023-07-18 DIAGNOSIS — S22080D Wedge compression fracture of T11-T12 vertebra, subsequent encounter for fracture with routine healing: Secondary | ICD-10-CM | POA: Diagnosis not present

## 2023-07-18 DIAGNOSIS — E1165 Type 2 diabetes mellitus with hyperglycemia: Secondary | ICD-10-CM | POA: Diagnosis not present

## 2023-07-18 DIAGNOSIS — F411 Generalized anxiety disorder: Secondary | ICD-10-CM | POA: Diagnosis not present

## 2023-07-18 DIAGNOSIS — S6992XD Unspecified injury of left wrist, hand and finger(s), subsequent encounter: Secondary | ICD-10-CM | POA: Diagnosis not present

## 2023-07-19 ENCOUNTER — Telehealth: Payer: Self-pay

## 2023-07-19 DIAGNOSIS — S6992XD Unspecified injury of left wrist, hand and finger(s), subsequent encounter: Secondary | ICD-10-CM | POA: Diagnosis not present

## 2023-07-19 DIAGNOSIS — F411 Generalized anxiety disorder: Secondary | ICD-10-CM | POA: Diagnosis not present

## 2023-07-19 DIAGNOSIS — E1165 Type 2 diabetes mellitus with hyperglycemia: Secondary | ICD-10-CM | POA: Diagnosis not present

## 2023-07-19 DIAGNOSIS — C159 Malignant neoplasm of esophagus, unspecified: Secondary | ICD-10-CM | POA: Diagnosis not present

## 2023-07-19 DIAGNOSIS — S22080D Wedge compression fracture of T11-T12 vertebra, subsequent encounter for fracture with routine healing: Secondary | ICD-10-CM | POA: Diagnosis not present

## 2023-07-19 DIAGNOSIS — S41111D Laceration without foreign body of right upper arm, subsequent encounter: Secondary | ICD-10-CM | POA: Diagnosis not present

## 2023-07-19 NOTE — Telephone Encounter (Signed)
 Received call from [facility] with [facility] requesting pull PICC orders. Relayed that orders to pull line after last dose on 6/10 were communicated to [facility].   She requested orders be faxed to [phone].  Orders to get last set of labs and pull PICC after last dose on 6/10 faxed.  Damain Broadus, BSN, RN

## 2023-07-25 DIAGNOSIS — F411 Generalized anxiety disorder: Secondary | ICD-10-CM | POA: Diagnosis not present

## 2023-07-25 DIAGNOSIS — S6992XD Unspecified injury of left wrist, hand and finger(s), subsequent encounter: Secondary | ICD-10-CM | POA: Diagnosis not present

## 2023-07-25 DIAGNOSIS — S22080D Wedge compression fracture of T11-T12 vertebra, subsequent encounter for fracture with routine healing: Secondary | ICD-10-CM | POA: Diagnosis not present

## 2023-07-25 DIAGNOSIS — S41111D Laceration without foreign body of right upper arm, subsequent encounter: Secondary | ICD-10-CM | POA: Diagnosis not present

## 2023-07-25 DIAGNOSIS — E1165 Type 2 diabetes mellitus with hyperglycemia: Secondary | ICD-10-CM | POA: Diagnosis not present

## 2023-07-25 DIAGNOSIS — C159 Malignant neoplasm of esophagus, unspecified: Secondary | ICD-10-CM | POA: Diagnosis not present

## 2023-07-26 DIAGNOSIS — S22080D Wedge compression fracture of T11-T12 vertebra, subsequent encounter for fracture with routine healing: Secondary | ICD-10-CM | POA: Diagnosis not present

## 2023-07-26 DIAGNOSIS — C159 Malignant neoplasm of esophagus, unspecified: Secondary | ICD-10-CM | POA: Diagnosis not present

## 2023-07-26 DIAGNOSIS — F411 Generalized anxiety disorder: Secondary | ICD-10-CM | POA: Diagnosis not present

## 2023-07-26 DIAGNOSIS — E1165 Type 2 diabetes mellitus with hyperglycemia: Secondary | ICD-10-CM | POA: Diagnosis not present

## 2023-07-26 DIAGNOSIS — S6992XD Unspecified injury of left wrist, hand and finger(s), subsequent encounter: Secondary | ICD-10-CM | POA: Diagnosis not present

## 2023-07-26 DIAGNOSIS — S41111D Laceration without foreign body of right upper arm, subsequent encounter: Secondary | ICD-10-CM | POA: Diagnosis not present

## 2023-07-29 DIAGNOSIS — Z743 Need for continuous supervision: Secondary | ICD-10-CM | POA: Diagnosis not present

## 2023-07-29 DIAGNOSIS — Z8501 Personal history of malignant neoplasm of esophagus: Secondary | ICD-10-CM | POA: Diagnosis not present

## 2023-07-29 DIAGNOSIS — R7989 Other specified abnormal findings of blood chemistry: Secondary | ICD-10-CM | POA: Diagnosis not present

## 2023-07-29 DIAGNOSIS — J90 Pleural effusion, not elsewhere classified: Secondary | ICD-10-CM | POA: Diagnosis not present

## 2023-07-29 DIAGNOSIS — K920 Hematemesis: Secondary | ICD-10-CM | POA: Diagnosis not present

## 2023-07-29 DIAGNOSIS — Z87891 Personal history of nicotine dependence: Secondary | ICD-10-CM | POA: Diagnosis not present

## 2023-07-29 DIAGNOSIS — Z8719 Personal history of other diseases of the digestive system: Secondary | ICD-10-CM | POA: Diagnosis not present

## 2023-07-30 DIAGNOSIS — L89154 Pressure ulcer of sacral region, stage 4: Secondary | ICD-10-CM | POA: Diagnosis not present

## 2023-07-30 DIAGNOSIS — E440 Moderate protein-calorie malnutrition: Secondary | ICD-10-CM | POA: Diagnosis not present

## 2023-07-30 DIAGNOSIS — R1314 Dysphagia, pharyngoesophageal phase: Secondary | ICD-10-CM | POA: Diagnosis not present

## 2023-07-30 DIAGNOSIS — I083 Combined rheumatic disorders of mitral, aortic and tricuspid valves: Secondary | ICD-10-CM | POA: Diagnosis not present

## 2023-07-30 DIAGNOSIS — K219 Gastro-esophageal reflux disease without esophagitis: Secondary | ICD-10-CM | POA: Diagnosis not present

## 2023-07-30 DIAGNOSIS — D500 Iron deficiency anemia secondary to blood loss (chronic): Secondary | ICD-10-CM | POA: Diagnosis not present

## 2023-07-30 DIAGNOSIS — I2510 Atherosclerotic heart disease of native coronary artery without angina pectoris: Secondary | ICD-10-CM | POA: Diagnosis not present

## 2023-07-30 DIAGNOSIS — M4316 Spondylolisthesis, lumbar region: Secondary | ICD-10-CM | POA: Diagnosis not present

## 2023-07-30 DIAGNOSIS — M6281 Muscle weakness (generalized): Secondary | ICD-10-CM | POA: Diagnosis not present

## 2023-07-30 DIAGNOSIS — R918 Other nonspecific abnormal finding of lung field: Secondary | ICD-10-CM | POA: Diagnosis not present

## 2023-07-30 DIAGNOSIS — R1110 Vomiting, unspecified: Secondary | ICD-10-CM | POA: Diagnosis not present

## 2023-07-30 DIAGNOSIS — L89894 Pressure ulcer of other site, stage 4: Secondary | ICD-10-CM | POA: Diagnosis not present

## 2023-07-30 DIAGNOSIS — D72829 Elevated white blood cell count, unspecified: Secondary | ICD-10-CM | POA: Diagnosis not present

## 2023-07-30 DIAGNOSIS — Z923 Personal history of irradiation: Secondary | ICD-10-CM | POA: Diagnosis not present

## 2023-07-30 DIAGNOSIS — Z66 Do not resuscitate: Secondary | ICD-10-CM | POA: Diagnosis not present

## 2023-07-30 DIAGNOSIS — I482 Chronic atrial fibrillation, unspecified: Secondary | ICD-10-CM | POA: Diagnosis not present

## 2023-07-30 DIAGNOSIS — R2681 Unsteadiness on feet: Secondary | ICD-10-CM | POA: Diagnosis not present

## 2023-07-30 DIAGNOSIS — R079 Chest pain, unspecified: Secondary | ICD-10-CM | POA: Diagnosis not present

## 2023-07-30 DIAGNOSIS — Z7401 Bed confinement status: Secondary | ICD-10-CM | POA: Diagnosis not present

## 2023-07-30 DIAGNOSIS — J449 Chronic obstructive pulmonary disease, unspecified: Secondary | ICD-10-CM | POA: Diagnosis not present

## 2023-07-30 DIAGNOSIS — Z87891 Personal history of nicotine dependence: Secondary | ICD-10-CM | POA: Diagnosis not present

## 2023-07-30 DIAGNOSIS — Z9221 Personal history of antineoplastic chemotherapy: Secondary | ICD-10-CM | POA: Diagnosis not present

## 2023-07-30 DIAGNOSIS — F0390 Unspecified dementia without behavioral disturbance: Secondary | ICD-10-CM | POA: Diagnosis not present

## 2023-07-30 DIAGNOSIS — K254 Chronic or unspecified gastric ulcer with hemorrhage: Secondary | ICD-10-CM | POA: Diagnosis not present

## 2023-07-30 DIAGNOSIS — I4891 Unspecified atrial fibrillation: Secondary | ICD-10-CM | POA: Diagnosis not present

## 2023-07-30 DIAGNOSIS — F0394 Unspecified dementia, unspecified severity, with anxiety: Secondary | ICD-10-CM | POA: Diagnosis not present

## 2023-07-30 DIAGNOSIS — K920 Hematemesis: Secondary | ICD-10-CM | POA: Diagnosis not present

## 2023-07-30 DIAGNOSIS — Z8501 Personal history of malignant neoplasm of esophagus: Secondary | ICD-10-CM | POA: Diagnosis not present

## 2023-07-30 DIAGNOSIS — K922 Gastrointestinal hemorrhage, unspecified: Secondary | ICD-10-CM | POA: Diagnosis not present

## 2023-07-30 DIAGNOSIS — M8618 Other acute osteomyelitis, other site: Secondary | ICD-10-CM | POA: Diagnosis not present

## 2023-07-30 DIAGNOSIS — K921 Melena: Secondary | ICD-10-CM | POA: Diagnosis not present

## 2023-07-30 DIAGNOSIS — Z8719 Personal history of other diseases of the digestive system: Secondary | ICD-10-CM | POA: Diagnosis not present

## 2023-07-30 DIAGNOSIS — L89104 Pressure ulcer of unspecified part of back, stage 4: Secondary | ICD-10-CM | POA: Diagnosis not present

## 2023-07-30 DIAGNOSIS — E46 Unspecified protein-calorie malnutrition: Secondary | ICD-10-CM | POA: Diagnosis not present

## 2023-07-30 DIAGNOSIS — M810 Age-related osteoporosis without current pathological fracture: Secondary | ICD-10-CM | POA: Diagnosis not present

## 2023-07-30 DIAGNOSIS — I10 Essential (primary) hypertension: Secondary | ICD-10-CM | POA: Diagnosis not present

## 2023-07-30 DIAGNOSIS — K259 Gastric ulcer, unspecified as acute or chronic, without hemorrhage or perforation: Secondary | ICD-10-CM | POA: Diagnosis not present

## 2023-07-30 DIAGNOSIS — J90 Pleural effusion, not elsewhere classified: Secondary | ICD-10-CM | POA: Diagnosis not present

## 2023-07-30 DIAGNOSIS — E785 Hyperlipidemia, unspecified: Secondary | ICD-10-CM | POA: Diagnosis not present

## 2023-07-30 DIAGNOSIS — R1310 Dysphagia, unspecified: Secondary | ICD-10-CM | POA: Diagnosis not present

## 2023-07-30 DIAGNOSIS — D72823 Leukemoid reaction: Secondary | ICD-10-CM | POA: Diagnosis not present

## 2023-07-30 DIAGNOSIS — R634 Abnormal weight loss: Secondary | ICD-10-CM | POA: Diagnosis not present

## 2023-07-30 DIAGNOSIS — D649 Anemia, unspecified: Secondary | ICD-10-CM | POA: Diagnosis not present

## 2023-07-30 DIAGNOSIS — Z7982 Long term (current) use of aspirin: Secondary | ICD-10-CM | POA: Diagnosis not present

## 2023-07-30 DIAGNOSIS — F03918 Unspecified dementia, unspecified severity, with other behavioral disturbance: Secondary | ICD-10-CM | POA: Diagnosis not present

## 2023-07-30 DIAGNOSIS — K2210 Ulcer of esophagus without bleeding: Secondary | ICD-10-CM | POA: Diagnosis not present

## 2023-07-30 DIAGNOSIS — I2722 Pulmonary hypertension due to left heart disease: Secondary | ICD-10-CM | POA: Diagnosis not present

## 2023-07-30 DIAGNOSIS — D62 Acute posthemorrhagic anemia: Secondary | ICD-10-CM | POA: Diagnosis not present

## 2023-07-30 DIAGNOSIS — R7989 Other specified abnormal findings of blood chemistry: Secondary | ICD-10-CM | POA: Diagnosis not present

## 2023-07-30 DIAGNOSIS — Z681 Body mass index (BMI) 19 or less, adult: Secondary | ICD-10-CM | POA: Diagnosis not present

## 2023-07-30 DIAGNOSIS — F411 Generalized anxiety disorder: Secondary | ICD-10-CM | POA: Diagnosis not present

## 2023-08-04 DIAGNOSIS — J189 Pneumonia, unspecified organism: Secondary | ICD-10-CM | POA: Diagnosis not present

## 2023-08-04 DIAGNOSIS — M810 Age-related osteoporosis without current pathological fracture: Secondary | ICD-10-CM | POA: Diagnosis not present

## 2023-08-04 DIAGNOSIS — I4891 Unspecified atrial fibrillation: Secondary | ICD-10-CM | POA: Diagnosis not present

## 2023-08-04 DIAGNOSIS — K922 Gastrointestinal hemorrhage, unspecified: Secondary | ICD-10-CM | POA: Diagnosis not present

## 2023-08-04 DIAGNOSIS — L89894 Pressure ulcer of other site, stage 4: Secondary | ICD-10-CM | POA: Diagnosis not present

## 2023-08-04 DIAGNOSIS — D649 Anemia, unspecified: Secondary | ICD-10-CM | POA: Diagnosis not present

## 2023-08-04 DIAGNOSIS — R7989 Other specified abnormal findings of blood chemistry: Secondary | ICD-10-CM | POA: Diagnosis not present

## 2023-08-04 DIAGNOSIS — K920 Hematemesis: Secondary | ICD-10-CM | POA: Diagnosis not present

## 2023-08-04 DIAGNOSIS — F419 Anxiety disorder, unspecified: Secondary | ICD-10-CM | POA: Diagnosis not present

## 2023-08-04 DIAGNOSIS — R4182 Altered mental status, unspecified: Secondary | ICD-10-CM | POA: Diagnosis not present

## 2023-08-04 DIAGNOSIS — E785 Hyperlipidemia, unspecified: Secondary | ICD-10-CM | POA: Diagnosis not present

## 2023-08-04 DIAGNOSIS — D500 Iron deficiency anemia secondary to blood loss (chronic): Secondary | ICD-10-CM | POA: Diagnosis not present

## 2023-08-04 DIAGNOSIS — E46 Unspecified protein-calorie malnutrition: Secondary | ICD-10-CM | POA: Diagnosis not present

## 2023-08-04 DIAGNOSIS — R6521 Severe sepsis with septic shock: Secondary | ICD-10-CM | POA: Diagnosis not present

## 2023-08-04 DIAGNOSIS — M8618 Other acute osteomyelitis, other site: Secondary | ICD-10-CM | POA: Diagnosis not present

## 2023-08-04 DIAGNOSIS — K219 Gastro-esophageal reflux disease without esophagitis: Secondary | ICD-10-CM | POA: Diagnosis not present

## 2023-08-04 DIAGNOSIS — J449 Chronic obstructive pulmonary disease, unspecified: Secondary | ICD-10-CM | POA: Diagnosis not present

## 2023-08-04 DIAGNOSIS — R1310 Dysphagia, unspecified: Secondary | ICD-10-CM | POA: Diagnosis not present

## 2023-08-04 DIAGNOSIS — Z515 Encounter for palliative care: Secondary | ICD-10-CM | POA: Diagnosis not present

## 2023-08-04 DIAGNOSIS — Z8501 Personal history of malignant neoplasm of esophagus: Secondary | ICD-10-CM | POA: Diagnosis not present

## 2023-08-04 DIAGNOSIS — L8990 Pressure ulcer of unspecified site, unspecified stage: Secondary | ICD-10-CM | POA: Diagnosis not present

## 2023-08-04 DIAGNOSIS — I482 Chronic atrial fibrillation, unspecified: Secondary | ICD-10-CM | POA: Diagnosis not present

## 2023-08-04 DIAGNOSIS — F411 Generalized anxiety disorder: Secondary | ICD-10-CM | POA: Diagnosis not present

## 2023-08-04 DIAGNOSIS — K254 Chronic or unspecified gastric ulcer with hemorrhage: Secondary | ICD-10-CM | POA: Diagnosis not present

## 2023-08-04 DIAGNOSIS — J9601 Acute respiratory failure with hypoxia: Secondary | ICD-10-CM | POA: Diagnosis not present

## 2023-08-04 DIAGNOSIS — I10 Essential (primary) hypertension: Secondary | ICD-10-CM | POA: Diagnosis not present

## 2023-08-04 DIAGNOSIS — R2681 Unsteadiness on feet: Secondary | ICD-10-CM | POA: Diagnosis not present

## 2023-08-04 DIAGNOSIS — A419 Sepsis, unspecified organism: Secondary | ICD-10-CM | POA: Diagnosis not present

## 2023-08-04 DIAGNOSIS — E43 Unspecified severe protein-calorie malnutrition: Secondary | ICD-10-CM | POA: Diagnosis not present

## 2023-08-04 DIAGNOSIS — R079 Chest pain, unspecified: Secondary | ICD-10-CM | POA: Diagnosis not present

## 2023-08-04 DIAGNOSIS — M6281 Muscle weakness (generalized): Secondary | ICD-10-CM | POA: Diagnosis not present

## 2023-08-04 DIAGNOSIS — M4620 Osteomyelitis of vertebra, site unspecified: Secondary | ICD-10-CM | POA: Diagnosis not present

## 2023-08-04 DIAGNOSIS — R1314 Dysphagia, pharyngoesophageal phase: Secondary | ICD-10-CM | POA: Diagnosis not present

## 2023-08-04 DIAGNOSIS — F3340 Major depressive disorder, recurrent, in remission, unspecified: Secondary | ICD-10-CM | POA: Diagnosis not present

## 2023-08-04 DIAGNOSIS — F32A Depression, unspecified: Secondary | ICD-10-CM | POA: Diagnosis not present

## 2023-08-04 DIAGNOSIS — R570 Cardiogenic shock: Secondary | ICD-10-CM | POA: Diagnosis not present

## 2023-08-04 DIAGNOSIS — Z66 Do not resuscitate: Secondary | ICD-10-CM | POA: Diagnosis not present

## 2023-08-05 DIAGNOSIS — Z66 Do not resuscitate: Secondary | ICD-10-CM | POA: Diagnosis not present

## 2023-08-05 DIAGNOSIS — M4620 Osteomyelitis of vertebra, site unspecified: Secondary | ICD-10-CM | POA: Diagnosis not present

## 2023-08-05 DIAGNOSIS — L8990 Pressure ulcer of unspecified site, unspecified stage: Secondary | ICD-10-CM | POA: Diagnosis not present

## 2023-08-05 DIAGNOSIS — F419 Anxiety disorder, unspecified: Secondary | ICD-10-CM | POA: Diagnosis not present

## 2023-08-05 DIAGNOSIS — R4182 Altered mental status, unspecified: Secondary | ICD-10-CM | POA: Diagnosis not present

## 2023-08-05 DIAGNOSIS — I4891 Unspecified atrial fibrillation: Secondary | ICD-10-CM | POA: Diagnosis not present

## 2023-08-05 DIAGNOSIS — R570 Cardiogenic shock: Secondary | ICD-10-CM | POA: Diagnosis not present

## 2023-08-05 DIAGNOSIS — Z8501 Personal history of malignant neoplasm of esophagus: Secondary | ICD-10-CM | POA: Diagnosis not present

## 2023-08-05 DIAGNOSIS — Z515 Encounter for palliative care: Secondary | ICD-10-CM | POA: Diagnosis not present

## 2023-08-05 DIAGNOSIS — E785 Hyperlipidemia, unspecified: Secondary | ICD-10-CM | POA: Diagnosis not present

## 2023-08-05 DIAGNOSIS — R079 Chest pain, unspecified: Secondary | ICD-10-CM | POA: Diagnosis not present

## 2023-08-05 DIAGNOSIS — R6521 Severe sepsis with septic shock: Secondary | ICD-10-CM | POA: Diagnosis not present

## 2023-08-05 DIAGNOSIS — F32A Depression, unspecified: Secondary | ICD-10-CM | POA: Diagnosis not present

## 2023-08-05 DIAGNOSIS — J189 Pneumonia, unspecified organism: Secondary | ICD-10-CM | POA: Diagnosis not present

## 2023-08-05 DIAGNOSIS — E43 Unspecified severe protein-calorie malnutrition: Secondary | ICD-10-CM | POA: Diagnosis not present

## 2023-08-05 DIAGNOSIS — J449 Chronic obstructive pulmonary disease, unspecified: Secondary | ICD-10-CM | POA: Diagnosis not present

## 2023-08-05 DIAGNOSIS — K254 Chronic or unspecified gastric ulcer with hemorrhage: Secondary | ICD-10-CM | POA: Diagnosis not present

## 2023-08-05 DIAGNOSIS — F3340 Major depressive disorder, recurrent, in remission, unspecified: Secondary | ICD-10-CM | POA: Diagnosis not present

## 2023-08-05 DIAGNOSIS — J9601 Acute respiratory failure with hypoxia: Secondary | ICD-10-CM | POA: Diagnosis not present

## 2023-08-09 DEATH — deceased
